# Patient Record
Sex: Male | Born: 1950
Health system: Southern US, Community
[De-identification: ages and names within clinical notes are randomized; demographics above are authoritative.]

## PROBLEM LIST (undated history)

## (undated) ENCOUNTER — Emergency Department: Disposition: A | Discharge status: Home / Self Care | Payer: PPO

## (undated) DIAGNOSIS — M1990 Unspecified osteoarthritis, unspecified site: Secondary | ICD-10-CM

## (undated) DIAGNOSIS — C801 Malignant (primary) neoplasm, unspecified: Secondary | ICD-10-CM

## (undated) DIAGNOSIS — C4491 Basal cell carcinoma of skin, unspecified: Secondary | ICD-10-CM

## (undated) HISTORY — DX: Basal cell carcinoma of skin, unspecified: C44.91

## (undated) HISTORY — PX: KNEE ARTHROSCOPY: SUR90

## (undated) HISTORY — PX: OTHER SURGICAL HISTORY: SHX169

---

## 1998-09-10 ENCOUNTER — Inpatient Hospital Stay (HOSPITAL_COMMUNITY): Admission: EM | Admit: 1998-09-10 | Discharge: 1998-09-13 | Payer: Self-pay | Admitting: Emergency Medicine

## 1998-09-10 ENCOUNTER — Encounter: Payer: Self-pay | Admitting: Emergency Medicine

## 2005-10-09 ENCOUNTER — Encounter: Payer: Self-pay | Admitting: Emergency Medicine

## 2007-03-23 ENCOUNTER — Emergency Department (HOSPITAL_COMMUNITY): Admission: EM | Admit: 2007-03-23 | Discharge: 2007-03-23 | Payer: Self-pay | Admitting: Emergency Medicine

## 2011-12-27 ENCOUNTER — Ambulatory Visit: Payer: Self-pay | Admitting: Family Medicine

## 2012-01-10 ENCOUNTER — Ambulatory Visit
Admission: RE | Admit: 2012-01-10 | Discharge: 2012-01-10 | Disposition: A | Discharge status: Home / Self Care | Payer: 59 | Source: Ambulatory Visit | Attending: Family Medicine | Admitting: Family Medicine

## 2012-01-10 ENCOUNTER — Other Ambulatory Visit: Payer: Self-pay | Admitting: Family Medicine

## 2012-01-10 DIAGNOSIS — Z01811 Encounter for preprocedural respiratory examination: Secondary | ICD-10-CM

## 2012-01-29 ENCOUNTER — Encounter (HOSPITAL_COMMUNITY): Payer: Self-pay | Admitting: Pharmacy Technician

## 2012-02-02 NOTE — H&P (Signed)
   Adam Liu is an 61 y.o. male.    Chief Complaint:  Left hip OA and pain   HPI: Pt is a 61 y.o. male complaining of left hip pain for 1+ years, denies any trauma or incident. Pain had continually increased since the beginning. X-rays in the clinic show end-stage arthritic changes of the left hip. Pt has tried various conservative treatments which have failed to alleviate their symptoms. Various options are discussed with the patient. Risks, benefits and expectations were discussed with the patient. Patient understand the risks, benefits and expectations and wishes to proceed with surgery.   PCP:  No primary provider on file.  D/C Plans:  Home with HHPT  Post-op Meds:   Rx given for ASA, Robaxin, Iron, Colace and MiraLax  Tranexamic Acid:   To be given  Decadron:   To be given  PMH: Denies any past medical history  PSH: - Left knee arthroscopy - Right knee arthroscopy - ORIF of foor for congenital bone defect  Social History: Denies the use of tobacco. Admits to the rare use of alcohol  Allergies:  No Known Allergies  Medications: Denies any medication  ROS: Review of Systems  Constitutional: Negative.   HENT: Negative.   Eyes: Negative.   Respiratory: Negative.   Cardiovascular: Negative.   Gastrointestinal: Negative.   Genitourinary: Negative.   Musculoskeletal: Positive for joint pain.  Skin: Negative.   Neurological: Negative.   Endo/Heme/Allergies: Negative.   Psychiatric/Behavioral: Negative.      Physical Exam: BP:   181/97  ;  HR:   63  ; Resp:   16  : Physical Exam  Constitutional: He is oriented to person, place, and time and well-developed, well-nourished, and in no distress.  HENT:  Head: Normocephalic and atraumatic.  Nose: Nose normal.  Mouth/Throat: Oropharynx is clear and moist.  Eyes: Pupils are equal, round, and reactive to light.  Neck: Neck supple. No JVD present. No tracheal deviation present. No thyromegaly present.    Cardiovascular: Normal rate, regular rhythm, normal heart sounds and intact distal pulses.   Pulmonary/Chest: Effort normal and breath sounds normal. No stridor. No respiratory distress. He has no wheezes. He exhibits no tenderness.  Abdominal: Soft. There is no tenderness. There is no guarding.  Musculoskeletal:       Left hip: He exhibits decreased range of motion, decreased strength, tenderness and bony tenderness. He exhibits no swelling, no deformity and no laceration.  Lymphadenopathy:    He has no cervical adenopathy.  Neurological: He is alert and oriented to person, place, and time.  Skin: Skin is warm and dry.  Psychiatric: Affect normal.     Assessment/Plan Assessment:   Left hip OA and pain   Plan: Patient will undergo a left total hip arthroplasty, anterior approach on 02/11/2012 per Dr. Charlann Boxer at [facility]. Risks benefits and expectations were discussed with the patient. Patient understand risks, benefits and expectations and wishes to proceed.   Anastasio Auerbach Adam Liu   PAC  02/02/2012, 12:20 PM

## 2012-02-03 NOTE — Patient Instructions (Signed)
20 Adam Liu  02/03/2012   Your procedure is scheduled on:  02/11/12 [id number]  Report to [facility] at 0615 AM.  Call this number if you have problems the morning of surgery: [phone]   Remember:   Do not eat food:After Midnight.  May have clear liquids:until Midnight .   Take these medicines the morning of surgery with A SIP OF WATER:   Do not wear jewelry,   Do not wear lotions, powders, or perfumes..  . Men may shave face and neck.  Do not bring valuables to the hospital.  Contacts, dentures or bridgework may not be worn into surgery.  Leave suitcase in the car. After surgery it may be brought to your room.  For patients admitted to the hospital, checkout time is 11:00 AM the day of discharge.     Special Instructions: Shower chin to toes with CHG.  The nite before and the am of surgery. Use regular soap for face and private parts.    Please read over the following fact sheets that you were given: MRSA Information, Blood Transfusion Fact Sheet, coughing and deep breathing exercises, leg exercises , Incentive Spirometry Fact Sheet.

## 2012-02-04 ENCOUNTER — Encounter (HOSPITAL_COMMUNITY)
Admission: RE | Admit: 2012-02-04 | Discharge: 2012-02-04 | Disposition: A | Discharge status: Home / Self Care | Payer: 59 | Source: Ambulatory Visit | Attending: Orthopedic Surgery | Admitting: Orthopedic Surgery

## 2012-02-04 ENCOUNTER — Encounter (HOSPITAL_COMMUNITY): Payer: Self-pay

## 2012-02-04 HISTORY — DX: Malignant (primary) neoplasm, unspecified: C80.1

## 2012-02-04 HISTORY — DX: Unspecified osteoarthritis, unspecified site: M19.90

## 2012-02-04 LAB — URINALYSIS, ROUTINE W REFLEX MICROSCOPIC
Glucose, UA: NEGATIVE mg/dL
Hgb urine dipstick: NEGATIVE
Specific Gravity, Urine: 1.022 (ref 1.005–1.030)
Urobilinogen, UA: 0.2 mg/dL (ref 0.0–1.0)
pH: 5.5 (ref 5.0–8.0)

## 2012-02-04 LAB — BASIC METABOLIC PANEL
Calcium: 9.5 mg/dL (ref 8.4–10.5)
Sodium: 141 mEq/L (ref 135–145)

## 2012-02-04 LAB — URINE MICROSCOPIC-ADD ON

## 2012-02-04 NOTE — Progress Notes (Signed)
Abnormal urinalysis and micro faxed and confirmation received to Dr Charlann Boxer.

## 2012-02-11 ENCOUNTER — Encounter (HOSPITAL_COMMUNITY): Payer: Self-pay | Admitting: *Deleted

## 2012-02-11 ENCOUNTER — Encounter (HOSPITAL_COMMUNITY): Payer: Self-pay | Admitting: Anesthesiology

## 2012-02-11 ENCOUNTER — Inpatient Hospital Stay (HOSPITAL_COMMUNITY): Payer: 59

## 2012-02-11 ENCOUNTER — Inpatient Hospital Stay (HOSPITAL_COMMUNITY)
Admission: RE | Admit: 2012-02-11 | Discharge: 2012-02-13 | DRG: 470 | Disposition: A | Discharge status: Home Health | Payer: 59 | Source: Ambulatory Visit | Attending: Orthopedic Surgery | Admitting: Orthopedic Surgery

## 2012-02-11 ENCOUNTER — Ambulatory Visit (HOSPITAL_COMMUNITY): Payer: 59 | Admitting: Anesthesiology

## 2012-02-11 ENCOUNTER — Encounter (HOSPITAL_COMMUNITY)
Admission: RE | Disposition: A | Discharge status: Home Health | Payer: Self-pay | Source: Ambulatory Visit | Attending: Orthopedic Surgery

## 2012-02-11 ENCOUNTER — Ambulatory Visit (HOSPITAL_COMMUNITY): Payer: 59

## 2012-02-11 DIAGNOSIS — Z6831 Body mass index (BMI) 31.0-31.9, adult: Secondary | ICD-10-CM

## 2012-02-11 DIAGNOSIS — M169 Osteoarthritis of hip, unspecified: Principal | ICD-10-CM | POA: Diagnosis present

## 2012-02-11 DIAGNOSIS — E669 Obesity, unspecified: Secondary | ICD-10-CM

## 2012-02-11 DIAGNOSIS — M1610 Unilateral primary osteoarthritis, unspecified hip: Principal | ICD-10-CM | POA: Diagnosis present

## 2012-02-11 DIAGNOSIS — Z96649 Presence of unspecified artificial hip joint: Secondary | ICD-10-CM

## 2012-02-11 DIAGNOSIS — D500 Iron deficiency anemia secondary to blood loss (chronic): Secondary | ICD-10-CM

## 2012-02-11 HISTORY — PX: TOTAL HIP ARTHROPLASTY: SHX124

## 2012-02-11 LAB — TYPE AND SCREEN

## 2012-02-11 LAB — ABO/RH: ABO/RH(D): O POS

## 2012-02-11 SURGERY — ARTHROPLASTY, HIP, TOTAL, ANTERIOR APPROACH
Anesthesia: Spinal | Site: Hip | Laterality: Left | Wound class: Clean

## 2012-02-11 MED ORDER — TRANEXAMIC ACID 100 MG/ML IV SOLN
1720.0000 mg | Freq: Once | INTRAVENOUS | Status: AC
  Administered 2012-02-11: 1720 mg via INTRAVENOUS
  Filled 2012-02-11: qty 17.2

## 2012-02-11 MED ORDER — METOCLOPRAMIDE HCL 10 MG PO TABS: 5.0000 mg | ORAL_TABLET | Freq: Three times a day (TID) | ORAL | Status: DC | PRN

## 2012-02-11 MED ORDER — SODIUM CHLORIDE 0.9 % IV SOLN
100.0000 mL/h | INTRAVENOUS | Status: DC
  Administered 2012-02-11 (×2): 100 mL/h via INTRAVENOUS
  Filled 2012-02-11 (×7): qty 1000

## 2012-02-11 MED ORDER — OXYCODONE HCL 5 MG/5ML PO SOLN: 5.0000 mg | Freq: Once | ORAL | Status: DC | PRN

## 2012-02-11 MED ORDER — PHENYLEPHRINE HCL 10 MG/ML IJ SOLN
INTRAMUSCULAR | Status: DC | PRN
  Administered 2012-02-11: 80 ug via INTRAVENOUS
  Administered 2012-02-11 (×5): 40 ug via INTRAVENOUS

## 2012-02-11 MED ORDER — OXYCODONE HCL 5 MG PO TABS: 5.0000 mg | ORAL_TABLET | Freq: Once | ORAL | Status: DC | PRN

## 2012-02-11 MED ORDER — PHENOL 1.4 % MT LIQD: 1.0000 | OROMUCOSAL | Status: DC | PRN

## 2012-02-11 MED ORDER — BISACODYL 10 MG RE SUPP: 10.0000 mg | Freq: Every day | RECTAL | Status: DC | PRN

## 2012-02-11 MED ORDER — FENTANYL CITRATE 0.05 MG/ML IJ SOLN
INTRAMUSCULAR | Status: DC | PRN
  Administered 2012-02-11: 50 ug via INTRAVENOUS

## 2012-02-11 MED ORDER — ACETAMINOPHEN 10 MG/ML IV SOLN
INTRAVENOUS | Status: DC | PRN
  Administered 2012-02-11: 1000 mg via INTRAVENOUS

## 2012-02-11 MED ORDER — CEFAZOLIN SODIUM-DEXTROSE 2-3 GM-% IV SOLR
2.0000 g | Freq: Four times a day (QID) | INTRAVENOUS | Status: AC
  Administered 2012-02-11 (×2): 2 g via INTRAVENOUS
  Filled 2012-02-11 (×2): qty 50

## 2012-02-11 MED ORDER — CEFAZOLIN SODIUM-DEXTROSE 2-3 GM-% IV SOLR
2.0000 g | INTRAVENOUS | Status: AC
  Administered 2012-02-11: 2 g via INTRAVENOUS

## 2012-02-11 MED ORDER — CHLORHEXIDINE GLUCONATE 4 % EX LIQD: 60.0000 mL | Freq: Once | CUTANEOUS | Status: DC

## 2012-02-11 MED ORDER — HYDROMORPHONE HCL PF 1 MG/ML IJ SOLN
0.5000 mg | INTRAMUSCULAR | Status: DC | PRN
  Administered 2012-02-11: 1 mg via INTRAVENOUS
  Filled 2012-02-11: qty 1

## 2012-02-11 MED ORDER — LACTATED RINGERS IV SOLN
INTRAVENOUS | Status: DC | PRN
  Administered 2012-02-11 (×4): via INTRAVENOUS

## 2012-02-11 MED ORDER — POLYETHYLENE GLYCOL 3350 17 G PO PACK
17.0000 g | PACK | Freq: Two times a day (BID) | ORAL | Status: DC
  Administered 2012-02-12 (×2): 17 g via ORAL

## 2012-02-11 MED ORDER — METOCLOPRAMIDE HCL 5 MG/ML IJ SOLN: 5.0000 mg | Freq: Three times a day (TID) | INTRAMUSCULAR | Status: DC | PRN

## 2012-02-11 MED ORDER — METHOCARBAMOL 100 MG/ML IJ SOLN
500.0000 mg | Freq: Four times a day (QID) | INTRAVENOUS | Status: DC | PRN
  Administered 2012-02-11: 500 mg via INTRAVENOUS
  Filled 2012-02-11: qty 5

## 2012-02-11 MED ORDER — ONDANSETRON HCL 4 MG PO TABS: 4.0000 mg | ORAL_TABLET | Freq: Four times a day (QID) | ORAL | Status: DC | PRN

## 2012-02-11 MED ORDER — DIPHENHYDRAMINE HCL 25 MG PO CAPS: 25.0000 mg | ORAL_CAPSULE | Freq: Four times a day (QID) | ORAL | Status: DC | PRN

## 2012-02-11 MED ORDER — ACETAMINOPHEN 10 MG/ML IV SOLN: 1000.0000 mg | Freq: Once | INTRAVENOUS | Status: DC | PRN

## 2012-02-11 MED ORDER — BUPIVACAINE HCL (PF) 0.5 % IJ SOLN
INTRAMUSCULAR | Status: AC
  Filled 2012-02-11: qty 30

## 2012-02-11 MED ORDER — METHOCARBAMOL 500 MG PO TABS
500.0000 mg | ORAL_TABLET | Freq: Four times a day (QID) | ORAL | Status: DC | PRN
  Administered 2012-02-11 – 2012-02-12 (×3): 500 mg via ORAL
  Filled 2012-02-11 (×3): qty 1

## 2012-02-11 MED ORDER — 0.9 % SODIUM CHLORIDE (POUR BTL) OPTIME
TOPICAL | Status: DC | PRN
  Administered 2012-02-11: 1000 mL

## 2012-02-11 MED ORDER — EPHEDRINE SULFATE 50 MG/ML IJ SOLN
INTRAMUSCULAR | Status: DC | PRN
  Administered 2012-02-11 (×2): 5 mg via INTRAVENOUS

## 2012-02-11 MED ORDER — FLEET ENEMA 7-19 GM/118ML RE ENEM: 1.0000 | ENEMA | Freq: Once | RECTAL | Status: AC | PRN

## 2012-02-11 MED ORDER — MENTHOL 3 MG MT LOZG: 1.0000 | LOZENGE | OROMUCOSAL | Status: DC | PRN

## 2012-02-11 MED ORDER — ZOLPIDEM TARTRATE 5 MG PO TABS: 5.0000 mg | ORAL_TABLET | Freq: Every evening | ORAL | Status: DC | PRN

## 2012-02-11 MED ORDER — DEXAMETHASONE SODIUM PHOSPHATE 10 MG/ML IJ SOLN: 10.0000 mg | Freq: Once | INTRAMUSCULAR | Status: DC

## 2012-02-11 MED ORDER — CEFAZOLIN SODIUM-DEXTROSE 2-3 GM-% IV SOLR
INTRAVENOUS | Status: AC
  Filled 2012-02-11: qty 50

## 2012-02-11 MED ORDER — DOCUSATE SODIUM 100 MG PO CAPS
100.0000 mg | ORAL_CAPSULE | Freq: Two times a day (BID) | ORAL | Status: DC
  Administered 2012-02-11 – 2012-02-12 (×3): 100 mg via ORAL

## 2012-02-11 MED ORDER — PROPOFOL INFUSION 10 MG/ML OPTIME
INTRAVENOUS | Status: DC | PRN
  Administered 2012-02-11: 75 ug/kg/min via INTRAVENOUS

## 2012-02-11 MED ORDER — LACTATED RINGERS IV SOLN: INTRAVENOUS | Status: DC

## 2012-02-11 MED ORDER — DEXAMETHASONE SODIUM PHOSPHATE 10 MG/ML IJ SOLN
10.0000 mg | Freq: Once | INTRAMUSCULAR | Status: AC
  Administered 2012-02-12: 10 mg via INTRAVENOUS
  Filled 2012-02-11: qty 1

## 2012-02-11 MED ORDER — MIDAZOLAM HCL 5 MG/5ML IJ SOLN
INTRAMUSCULAR | Status: DC | PRN
  Administered 2012-02-11: 2 mg via INTRAVENOUS

## 2012-02-11 MED ORDER — PROPOFOL 10 MG/ML IV BOLUS
INTRAVENOUS | Status: DC | PRN
  Administered 2012-02-11 (×3): 20 mg via INTRAVENOUS

## 2012-02-11 MED ORDER — BUPIVACAINE HCL (PF) 0.5 % IJ SOLN
INTRAMUSCULAR | Status: DC | PRN
  Administered 2012-02-11: 3 mL

## 2012-02-11 MED ORDER — ONDANSETRON HCL 4 MG/2ML IJ SOLN: 4.0000 mg | Freq: Four times a day (QID) | INTRAMUSCULAR | Status: DC | PRN

## 2012-02-11 MED ORDER — PROMETHAZINE HCL 25 MG/ML IJ SOLN: 6.2500 mg | INTRAMUSCULAR | Status: DC | PRN

## 2012-02-11 MED ORDER — ALUM & MAG HYDROXIDE-SIMETH 200-200-20 MG/5ML PO SUSP: 30.0000 mL | ORAL | Status: DC | PRN

## 2012-02-11 MED ORDER — MEPERIDINE HCL 50 MG/ML IJ SOLN: 6.2500 mg | INTRAMUSCULAR | Status: DC | PRN

## 2012-02-11 MED ORDER — HYDROCODONE-ACETAMINOPHEN 7.5-325 MG PO TABS
1.0000 | ORAL_TABLET | ORAL | Status: DC
  Administered 2012-02-11: 1 via ORAL
  Administered 2012-02-11: 2 via ORAL
  Administered 2012-02-11 – 2012-02-12 (×5): 1 via ORAL
  Filled 2012-02-11 (×8): qty 1

## 2012-02-11 MED ORDER — ACETAMINOPHEN 10 MG/ML IV SOLN
INTRAVENOUS | Status: AC
  Filled 2012-02-11: qty 100

## 2012-02-11 MED ORDER — CELECOXIB 200 MG PO CAPS
200.0000 mg | ORAL_CAPSULE | Freq: Two times a day (BID) | ORAL | Status: DC
  Administered 2012-02-11 – 2012-02-12 (×3): 200 mg via ORAL
  Filled 2012-02-11 (×5): qty 1

## 2012-02-11 MED ORDER — HYDROMORPHONE HCL PF 1 MG/ML IJ SOLN: 0.2500 mg | INTRAMUSCULAR | Status: DC | PRN

## 2012-02-11 MED ORDER — RIVAROXABAN 10 MG PO TABS
10.0000 mg | ORAL_TABLET | ORAL | Status: DC
  Administered 2012-02-12 – 2012-02-13 (×2): 10 mg via ORAL
  Filled 2012-02-11 (×3): qty 1

## 2012-02-11 MED ORDER — FERROUS SULFATE 325 (65 FE) MG PO TABS
325.0000 mg | ORAL_TABLET | Freq: Three times a day (TID) | ORAL | Status: DC
  Administered 2012-02-11 – 2012-02-13 (×5): 325 mg via ORAL
  Filled 2012-02-11 (×8): qty 1

## 2012-02-11 SURGICAL SUPPLY — 36 items
BAG ZIPLOCK 12X15 (MISCELLANEOUS) ×4 IMPLANT
BLADE SAW SGTL 18X1.27X75 (BLADE) ×2 IMPLANT
CLOTH BEACON ORANGE TIMEOUT ST (SAFETY) ×2 IMPLANT
DERMABOND ADVANCED (GAUZE/BANDAGES/DRESSINGS) ×1
DERMABOND ADVANCED .7 DNX12 (GAUZE/BANDAGES/DRESSINGS) ×1 IMPLANT
DRAPE C-ARM 42X72 X-RAY (DRAPES) ×2 IMPLANT
DRAPE STERI IOBAN 125X83 (DRAPES) ×2 IMPLANT
DRAPE U-SHAPE 47X51 STRL (DRAPES) ×6 IMPLANT
DRSG AQUACEL AG ADV 3.5X10 (GAUZE/BANDAGES/DRESSINGS) ×2 IMPLANT
DRSG TEGADERM 4X4.75 (GAUZE/BANDAGES/DRESSINGS) ×2 IMPLANT
DURAPREP 26ML APPLICATOR (WOUND CARE) ×2 IMPLANT
ELECT BLADE TIP CTD 4 INCH (ELECTRODE) ×2 IMPLANT
ELECT REM PT RETURN 9FT ADLT (ELECTROSURGICAL) ×2
ELECTRODE REM PT RTRN 9FT ADLT (ELECTROSURGICAL) ×1 IMPLANT
EVACUATOR 1/8 PVC DRAIN (DRAIN) IMPLANT
FACESHIELD LNG OPTICON STERILE (SAFETY) ×8 IMPLANT
GAUZE SPONGE 2X2 8PLY STRL LF (GAUZE/BANDAGES/DRESSINGS) ×1 IMPLANT
GLOVE BIOGEL PI IND STRL 7.5 (GLOVE) ×1 IMPLANT
GLOVE BIOGEL PI IND STRL 8 (GLOVE) ×1 IMPLANT
GLOVE BIOGEL PI INDICATOR 7.5 (GLOVE) ×1
GLOVE BIOGEL PI INDICATOR 8 (GLOVE) ×1
GLOVE ECLIPSE 8.0 STRL XLNG CF (GLOVE) ×2 IMPLANT
GLOVE ORTHO TXT STRL SZ7.5 (GLOVE) ×4 IMPLANT
GOWN BRE IMP PREV XXLGXLNG (GOWN DISPOSABLE) ×4 IMPLANT
GOWN STRL NON-REIN LRG LVL3 (GOWN DISPOSABLE) ×2 IMPLANT
KIT BASIN OR (CUSTOM PROCEDURE TRAY) ×2 IMPLANT
PACK TOTAL JOINT (CUSTOM PROCEDURE TRAY) ×2 IMPLANT
PADDING CAST COTTON 6X4 STRL (CAST SUPPLIES) ×2 IMPLANT
SPONGE GAUZE 2X2 STER 10/PKG (GAUZE/BANDAGES/DRESSINGS) ×1
SUCTION FRAZIER 12FR DISP (SUCTIONS) ×2 IMPLANT
SUT MNCRL AB 4-0 PS2 18 (SUTURE) ×2 IMPLANT
SUT VIC AB 1 CT1 36 (SUTURE) ×8 IMPLANT
SUT VIC AB 2-0 CT1 27 (SUTURE) ×2
SUT VIC AB 2-0 CT1 TAPERPNT 27 (SUTURE) ×2 IMPLANT
TOWEL OR 17X26 10 PK STRL BLUE (TOWEL DISPOSABLE) ×4 IMPLANT
TRAY FOLEY CATH 14FRSI W/METER (CATHETERS) ×2 IMPLANT

## 2012-02-11 NOTE — Progress Notes (Signed)
Anesthesiology Note  Pt with probable perioperative corneal abrasion. Discussed with Dr. Luciana Axe with ophthalmology his recommendations. He recommended erythromycin ophthalmic ointment TID x 7 days. Will prescribed and speak with pt.  Tyler Robidoux

## 2012-02-11 NOTE — Evaluation (Signed)
Physical Therapy Evaluation Patient Details Name: Adam Liu MRN: [id number] DOB: Oct 31, 1950 Today's Date: 02/11/2012 Time: 7829-5621 PT Time Calculation (min): 49 min  PT Assessment / Plan / Recommendation Clinical Impression  Pt. is S/P direct anterior THR from today. Pt was able to get up to recliner  with asssitance. LImited by dizziness/decreased BP.  RN aware. Pt/ will benefit from PT to improve infunctional mobility and strength to DC to home.    PT Assessment  Patient needs continued PT services    Follow Up Recommendations  Home health PT;Supervision/Assistance - 24 hour    Barriers to Discharge        Equipment Recommendations  Rolling walker with 5" wheels;3 in 1 bedside comode    Recommendations for Other Services OT consult   Frequency 7X/week    Precautions / Restrictions Precautions Precaution Comments: hypotension first time up.   Pertinent Vitals/Pain BP after ambulate to recliner= 98/50; after rest in recliner w/ legs elevated=116/61 sats .95% 2/ HR 72 after pivot to recliner. RN in and aware.      Mobility  Bed Mobility Bed Mobility: Supine to Sit Supine to Sit: HOB elevated;With rails;3: Mod assist Details for Bed Mobility Assistance: VC for moving trunk to side, required asssitance to move LLE to edge. Transfers Transfers: Sit to Stand;Stand to Sit Sit to Stand: From elevated surface;With upper extremity assist Sit to Stand: Patient Percentage: 70% Stand to Sit: 3: Mod assist;To chair/3-in-1;With upper extremity assist;With armrests Details for Transfer Assistance: verbal cues for hand placement w/ sit/standing, VC for LLE placement to sit down. Ambulation/Gait Ambulation/Gait Assistance: 1: +2 Total assist Ambulation/Gait: Patient Percentage: 70% Ambulation Distance (Feet): 5 Feet Assistive device: Rolling walker Ambulation/Gait Assistance Details: Pt became dizzy so had pt back up to recliner, VC for sequence, some difficulty advancing LLE  first. Gait Pattern: Step-to pattern;Antalgic;Decreased stance time - left;Decreased step length - left Gait velocity: decreased    Exercises Total Joint Exercises Heel Slides: AAROM;Left;10 reps;Supine   PT Diagnosis: Difficulty walking;Generalized weakness  PT Problem List: Decreased range of motion;Decreased activity tolerance;Decreased mobility;Cardiopulmonary status limiting activity PT Treatment Interventions: DME instruction;Gait training;Stair training;Functional mobility training;Therapeutic activities;Patient/family education   PT Goals Acute Rehab PT Goals PT Goal Formulation: With patient/family Time For Goal Achievement: 02/25/12 Potential to Achieve Goals: Good Pt will go Supine/Side to Sit: with supervision;with HOB 0 degrees PT Goal: Supine/Side to Sit - Progress: Goal set today Pt will go Sit to Supine/Side: with supervision;with HOB 0 degrees PT Goal: Sit to Supine/Side - Progress: Goal set today Pt will go Sit to Stand: with supervision PT Goal: Sit to Stand - Progress: Goal set today Pt will go Stand to Sit: with supervision PT Goal: Stand to Sit - Progress: Goal set today Pt will Ambulate: 51 - 150 feet;with least restrictive assistive device;with supervision PT Goal: Ambulate - Progress: Goal set today Pt will Go Up / Down Stairs: 6-9 stairs;with min assist;with least restrictive assistive device;3-5 stairs PT Goal: Up/Down Stairs - Progress: Goal set today Pt will Perform Home Exercise Program: with supervision, verbal cues required/provided PT Goal: Perform Home Exercise Program - Progress: Goal set today  Visit Information  Last PT Received On: 02/11/12 Assistance Needed: +2    Subjective Data  Subjective: I am so anxious. Will I hurt my hip if I put weight on it? Patient Stated Goal: To walk without pain.   Prior Functioning  Home Living Lives With: Spouse Available Help at Discharge: Family Type of Home: House Home Access:  Stairs to  enter [employer] of Steps: 4 Entrance Stairs-Rails: Right;Left Home Layout: Two level;Bed/bath upstairs;1/2 bath on main level;Other (Comment) (sofa/bed on first level.) Alternate Level Stairs-Number of Steps: 16 Alternate Level Stairs-Rails: Left Bathroom Toilet: Standard Home Adaptive Equipment: Crutches Prior Function Level of Independence: Independent Able to Take Stairs?: Yes Driving: Yes Communication Communication: No difficulties    Cognition  Overall Cognitive Status: Appears within functional limits for tasks assessed/performed Arousal/Alertness: Awake/alert Orientation Level: Appears intact for tasks assessed Behavior During Session: Anxious    Extremity/Trunk Assessment Right Lower Extremity Assessment RLE ROM/Strength/Tone: [facility] for tasks assessed Left Lower Extremity Assessment LLE ROM/Strength/Tone: Deficits LLE ROM/Strength/Tone Deficits: moves foot well. LLE Sensation: WFL - Light Touch   Balance    End of Session PT - End of Session Activity Tolerance: Patient limited by fatigue;Treatment limited secondary to medical complications (Comment) Patient left: in chair;with call bell/phone within reach;with family/visitor present Nurse Communication: Mobility status (dizziness and decreased BP)  GP     Rada Hay 02/11/2012, 5:55 PM

## 2012-02-11 NOTE — Progress Notes (Signed)
Dr. Renold Don in and aware of right eye redness. Flushed with NS. States "It feels better.'

## 2012-02-11 NOTE — Op Note (Signed)
NAME:  Adam Liu                ACCOUNT NO.: [id number]      MEDICAL RECORD NO.: [id number]      FACILITY:  [facility]      PHYSICIAN:  Durene Romans D  DATE OF BIRTH:  02/19/1951     DATE OF PROCEDURE:  02/11/2012                                 OPERATIVE REPORT         PREOPERATIVE DIAGNOSIS: Left  hip osteoarthritis.      POSTOPERATIVE DIAGNOSIS:  Left hip osteoarthritis.      PROCEDURE:  Left total hip replacement through an anterior approach   utilizing DePuy THR system, component size 54mm pinnacle cup, a size 36+4 neutral   Altrex liner, a size 8 Hi Tri Lock stem with a 36+5 delta ceramic   ball.      SURGEON:  Madlyn Frankel. Charlann Boxer, M.D.      ASSISTANT:  Lanney Gins, PA      ANESTHESIA:  Spinal.      SPECIMENS:  None.      COMPLICATIONS:  None.      BLOOD LOSS:  600 cc     DRAINS:  One Hemovac.      INDICATION OF THE PROCEDURE:  [name] is a 61 y.o. male who had   presented to office for evaluation of left hip pain.  Radiographs revealed   progressive degenerative changes with bone-on-bone   articulation to the  hip joint.  The patient had painful limited range of   motion significantly affecting their overall quality of life.  The patient was failing to    respond to conservative measures, and at this point was ready   to proceed with more definitive measures.  The patient has noted progressive   degenerative changes in his hip, progressive problems and dysfunction   with regarding the hip prior to surgery.  Consent was obtained for   benefit of pain relief.  Specific risk of infection, DVT, component   failure, dislocation, need for revision surgery, as well discussion of   the anterior versus posterior approach were reviewed.  Consent was   obtained for benefit of anterior pain relief through an anterior   approach.      PROCEDURE IN DETAIL:  The patient was brought to operative theater.   Once adequate anesthesia,  preoperative antibiotics, 2gm Ancef administered.   The patient was positioned supine on the OSI Hanna table.  Once adequate   padding of boney process was carried out, we had predraped out the hip, and  used fluoroscopy to confirm orientation of the pelvis and position.      The left hip was then prepped and draped from proximal iliac crest to   mid thigh with shower curtain technique.      Time-out was performed identifying the patient, planned procedure, and   extremity.     An incision was then made 2 cm distal and lateral to the   anterior superior iliac spine extending over the orientation of the   tensor fascia lata muscle and sharp dissection was carried down to the   fascia of the muscle and protractor placed in the soft tissues.      The fascia was then incised.  The muscle belly was identified and swept  laterally and retractor placed along the superior neck.  Following   cauterization of the circumflex vessels and removing some pericapsular   fat, a second cobra retractor was placed on the inferior neck.  A third   retractor was placed on the anterior acetabulum after elevating the   anterior rectus.  A L-capsulotomy was along the line of the   superior neck to the trochanteric fossa, then extended proximally and   distally.  Tag sutures were placed and the retractors were then placed   intracapsular.  We then identified the trochanteric fossa and   orientation of my neck cut, confirmed this radiographically   and then made a neck osteotomy with the femur on traction.  The femoral   head was removed without difficulty or complication.  Traction was let   off and retractors were placed posterior and anterior around the   acetabulum.      The labrum and foveal tissue were debrided.  I began reaming with a 47mm   reamer and reamed up to 53mm reamer with good bony bed preparation and a 54   cup was chosen.  The final 54mm Pinnacle cup was then impacted under fluoroscopy  to  confirm the depth of penetration and orientation with respect to   abduction.  A screw was placed followed by the hole eliminator.  The final   36+4 neutral Altrex liner was impacted with good visualized rim fit.  The cup was positioned anatomically within the acetabular portion of the pelvis.      At this point, the femur was rolled at 80 degrees.  Further capsule was   released off the inferior aspect of the femoral neck.  I then   released the superior capsule proximally.  The hook was placed laterally   along the femur and elevated manually and held in position with the bed   hook.  The leg was then extended and adducted with the leg rolled to 100   degrees of external rotation.  Once the proximal femur was fully   exposed, I used a box osteotome to set orientation.  I then began   broaching with the starting chili pepper broach and passed this by hand and then broached up to 8.  With the 8 broach in place I chose a high offset neck and did a trial reduction.  The offset was appropriate, leg lengths   appeared to be equal, confirmed radiographically, with a 36+5 ball.   Given these findings, I went ahead and dislocated the hip, repositioned all   retractors and positioned the right hip in the extended and abducted position.  The final 8 Hi Tri Lock stem was   chosen and it was impacted down to the level of neck cut.  Based on this   and the trial reduction, a 36+5 delta ceramic ball was chosen and   impacted onto a clean and dry trunnion, and the hip was reduced.  The   hip had been irrigated throughout the case again at this point.  I did   reapproximate the superior capsular leaflet to the anterior leaflet   using #1 Vicryl, placed a medium Hemovac drain deep.  The fascia of the   tensor fascia lata muscle was then reapproximated using #1 Vicryl.  The   remaining wound was closed with 2-0 Vicryl and running 4-0 Monocryl.   The hip was cleaned, dried, and dressed sterilely using  Dermabond and   Aquacel dressing.  Drain site dressed  separately.  She was then brought   to recovery room in stable condition tolerating the procedure well.    Lanney Gins, PA-C was present for the entirety of the case involved from   preoperative positioning, perioperative retractor management, general   facilitation of the case, as well as primary wound closure as assistant.            Madlyn Frankel Charlann Boxer, M.D.            MDO/MEDQ  D:  04/02/2011  T:  04/02/2011  Job:  [id number]      Electronically Signed by Durene Romans M.D. on 04/08/2011 09:15:38 AM

## 2012-02-11 NOTE — Interval H&P Note (Signed)
History and Physical Interval Note:  02/11/2012 7:31 AM  Adam Liu  has presented today for surgery, with the diagnosis of Osteoarthritis of the Left Hip  The various methods of treatment have been discussed with the patient and family. After consideration of risks, benefits and other options for treatment, the patient has consented to  Procedure(s) (LRB): LEFT TOTAL HIP ARTHROPLASTY ANTERIOR APPROACH (Left) as a surgical intervention .  The patient's history has been reviewed, patient examined, no change in status, stable for surgery.  I have reviewed the patient's chart and labs.  Questions were answered to the patient's satisfaction.     Shelda Pal

## 2012-02-11 NOTE — Anesthesia Preprocedure Evaluation (Addendum)
Anesthesia Evaluation  Patient identified by MRN, date of birth, ID band Patient awake    Reviewed: Allergy & Precautions, H&P , NPO status , Patient's Chart, lab work & pertinent test results  Airway Mallampati: I TM Distance: >3 FB Neck ROM: Full    Dental  (+) Teeth Intact and Dental Advisory Given   Pulmonary neg pulmonary ROS,  breath sounds clear to auscultation  Pulmonary exam normal       Cardiovascular - angina- CAD, - Past MI, - CHF and - DVT negative cardio ROS  Rhythm:Regular Rate:Normal     Neuro/Psych negative neurological ROS     GI/Hepatic negative GI ROS, Neg liver ROS,   Endo/Other  negative endocrine ROS  Renal/GU negative Renal ROS     Musculoskeletal  (+) Arthritis -, Osteoarthritis,    Abdominal   Peds  Hematology negative hematology ROS (+)   Anesthesia Other Findings   Reproductive/Obstetrics                          Anesthesia Physical Anesthesia Plan  ASA: II  Anesthesia Plan: Spinal   Post-op Pain Management:    Induction:   Airway Management Planned: Simple Face Mask  Additional Equipment:   Intra-op Plan:   Post-operative Plan:   Informed Consent: I have reviewed the patients History and Physical, chart, labs and discussed the procedure including the risks, benefits and alternatives for the proposed anesthesia with the patient or authorized representative who has indicated his/her understanding and acceptance.   Dental advisory given  Plan Discussed with: CRNA and Surgeon  Anesthesia Plan Comments:        Anesthesia Quick Evaluation

## 2012-02-11 NOTE — Transfer of Care (Signed)
Immediate Anesthesia Transfer of Care Note  Patient: Adam Liu  Procedure(s) Performed: Procedure(s) (LRB): TOTAL HIP ARTHROPLASTY ANTERIOR APPROACH (Left)  Patient Location: PACU  Anesthesia Type: Spinal  Level of Consciousness: sedated, patient cooperative and responds to stimulaton  Airway & Oxygen Therapy: Patient Spontanous Breathing and Patient connected to face mask oxgen  Post-op Assessment: Report given to PACU RN and Post -op Vital signs reviewed and stable  Post vital signs: Reviewed and stable  Complications: No apparent anesthesia complications

## 2012-02-11 NOTE — Addendum Note (Signed)
Addendum  created 02/11/12 1233 by Paris Lore, CRNA   Modules edited:Anesthesia Flowsheet

## 2012-02-11 NOTE — Anesthesia Postprocedure Evaluation (Signed)
Anesthesia Post Note  Patient: Adam Liu  Procedure(s) Performed: Procedure(s) (LRB): TOTAL HIP ARTHROPLASTY ANTERIOR APPROACH (Left)  Anesthesia type: Spinal  Patient location: PACU  Post pain: Pain level controlled  Post assessment: Post-op Vital signs reviewed  Last Vitals: BP 127/71  Pulse 60  Temp 36.3 C (Oral)  Resp 14  Wt 252 lb 13.9 oz (114.7 kg)  SpO2 100%  Post vital signs: Reviewed  Level of consciousness: sedated  Complications: Patient complaining of R eye discomfort. Eye is red and pt rubbing it. I reemphasized importance of not rubbing eyes. Possible corneal abrasion. Will consult ophthalmology for F/U post op. No other apparent anesthesia complications.

## 2012-02-11 NOTE — Addendum Note (Signed)
Addendum  created 02/11/12 1239 by Gaylan Gerold, MD   Modules edited:Inpatient Notes, Orders

## 2012-02-11 NOTE — Plan of Care (Signed)
Problem: Consults Goal: Diagnosis- Total Joint Replacement Left anterior hip     

## 2012-02-11 NOTE — Progress Notes (Signed)
C/O "feels like something in my right eye." Sclera reddened. Cool compress applied.

## 2012-02-11 NOTE — Anesthesia Procedure Notes (Signed)
Spinal  Patient location during procedure: OR Start time: 02/11/2012 9:08 AM End time: 02/11/2012 9:18 AM Staffing CRNA/Resident: Paris Lore Performed by: resident/CRNA  Preanesthetic Checklist Completed: patient identified, site marked, surgical consent, pre-op evaluation, timeout performed, IV checked, risks and benefits discussed and monitors and equipment checked Spinal Block Patient position: sitting Prep: DuraPrep Patient monitoring: heart rate, continuous pulse ox and blood pressure Location: L2-3 Injection technique: single-shot Needle Needle type: Spinocan  Needle gauge: 25 G Needle length: 9 cm Needle insertion depth: 5 cm Assessment Sensory level: T4 Additional Notes Expiration date of kit checked and confirmed. Patient tolerated procedure well, without complications.  L2-L3 X 1 with noted CSF clear return easy flush and aspiration of CSF.

## 2012-02-12 DIAGNOSIS — D500 Iron deficiency anemia secondary to blood loss (chronic): Secondary | ICD-10-CM

## 2012-02-12 DIAGNOSIS — E669 Obesity, unspecified: Secondary | ICD-10-CM

## 2012-02-12 LAB — BASIC METABOLIC PANEL
CO2: 25 mEq/L (ref 19–32)
Glucose, Bld: 119 mg/dL — ABNORMAL HIGH (ref 70–99)
Potassium: 4 mEq/L (ref 3.5–5.1)
Sodium: 139 mEq/L (ref 135–145)

## 2012-02-12 LAB — CBC
Hemoglobin: 12.5 g/dL — ABNORMAL LOW (ref 13.0–17.0)
MCH: 30.5 pg (ref 26.0–34.0)
RBC: 4.1 MIL/uL — ABNORMAL LOW (ref 4.22–5.81)

## 2012-02-12 MED ORDER — ACETAMINOPHEN 325 MG PO TABS
650.0000 mg | ORAL_TABLET | Freq: Three times a day (TID) | ORAL | Status: DC | PRN
  Administered 2012-02-12 – 2012-02-13 (×2): 650 mg via ORAL
  Filled 2012-02-12 (×3): qty 2

## 2012-02-12 NOTE — Evaluation (Signed)
Occupational Therapy Evaluation & Discharge Patient Details Name: Adam Liu MRN: [id number] DOB: 11-07-1950 Today's Date: 02/12/2012 Time: 4098-1191 OT Time Calculation (min): 18 min  OT Assessment / Plan / Recommendation Clinical Impression  Pt doing well POD 1 LTHR. All education completed. Pt will have necessary level of A from family upon d/c.    OT Assessment  Patient does not need any further OT services    Follow Up Recommendations  No OT follow up    Barriers to Discharge      Equipment Recommendations  3 in 1 bedside comode;Rolling walker with 5" wheels    Recommendations for Other Services    Frequency       Precautions / Restrictions Precautions Precautions: None Restrictions Weight Bearing Restrictions: No   Pertinent Vitals/Pain No c/o pain    ADL  Grooming: Simulated;Supervision/safety Where Assessed - Grooming: Supported standing Lower Body Dressing: Performed;Minimal assistance (to don/doff shorts) Where Assessed - Lower Body Dressing: Supported sit to stand Toilet Transfer: Performed;Minimal assistance Toilet Transfer Method: Sit to Barista: Regular height toilet;Grab bars Toileting - Clothing Manipulation and Hygiene: Simulated;Supervision/safety Where Assessed - Engineer, mining and Hygiene: Sit to stand from 3-in-1 or toilet Tub/Shower Transfer: Performed;Min guard Tub/Shower Transfer Method: Ambulating Equipment Used: Rolling walker ADL Comments: Pt struggled to rise from low toilet seat. Wife stated she would be available to help pt don/doff shoes and bathe LB.    OT Diagnosis:    OT Problem List:   OT Treatment Interventions:     OT Goals    Visit Information  Last OT Received On: 02/12/12 Assistance Needed: +1    Subjective Data  Subjective: I was a little light headed Patient Stated Goal: Return to work.   Prior Functioning  Vision/Perception  Home Living Lives With:  Spouse Available Help at Discharge: Family Type of Home: House Home Access: Stairs to enter Secretary/administrator of Steps: 4 Entrance Stairs-Rails: Right;Left Home Layout: Two level;Bed/bath upstairs;1/2 bath on main level Alternate Level Stairs-Number of Steps: 16 Alternate Level Stairs-Rails: Left Bathroom Shower/Tub: Health visitor: Standard Home Adaptive Equipment: Crutches;Built-in shower seat Prior Function Level of Independence: Independent Able to Take Stairs?: Yes Driving: Yes Vocation: Full time employment Communication Communication: No difficulties Dominant Hand: Right      Cognition  Overall Cognitive Status: Appears within functional limits for tasks assessed/performed Arousal/Alertness: Awake/alert Orientation Level: Appears intact for tasks assessed Behavior During Session: [facility] for tasks performed    Extremity/Trunk Assessment Right Upper Extremity Assessment RUE ROM/Strength/Tone: [facility] for tasks assessed Left Upper Extremity Assessment LUE ROM/Strength/Tone: [facility] for tasks assessed   Mobility  Shoulder Instructions  Transfers Sit to Stand: 4: Min guard;With upper extremity assist;From chair/3-in-1;From toilet;4: Min assist Stand to Sit: 4: Min guard;4: Min assist;To toilet;To chair/3-in-1;With armrests Details for Transfer Assistance: Pt required physical A to rise and control descent on std toilet. Min VCs for hand placement and LLE management.       Exercise     Balance     End of Session OT - End of Session Activity Tolerance: Patient tolerated treatment well Patient left: in chair;with call bell/phone within reach;with family/visitor present  GO     Adam Liu A OTR/L [phone] 02/12/2012, 1:50 PM

## 2012-02-12 NOTE — Progress Notes (Signed)
Physical Therapy Treatment Patient Details Name: Adam Liu MRN: [id number] DOB: 04/17/1951 Today's Date: 02/12/2012 Time: 4098-1191 PT Time Calculation (min): 30 min  PT Assessment / Plan / Recommendation Comments on Treatment Session  Marked improvement in activity tolerance with marked decrease in anxiety level    Follow Up Recommendations  Home health PT;Supervision/Assistance - 24 hour    Barriers to Discharge        Equipment Recommendations  Rolling walker with 5" wheels;3 in 1 bedside comode    Recommendations for Other Services OT consult  Frequency 7X/week   Plan Discharge plan remains appropriate    Precautions / Restrictions Precautions Precaution Comments: hypotension first time up. Restrictions Weight Bearing Restrictions: No Other Position/Activity Restrictions: WBAT   Pertinent Vitals/Pain 3/10' premedicated, ice packs provided    Mobility  Bed Mobility Bed Mobility: Supine to Sit Supine to Sit: 4: Min assist Details for Bed Mobility Assistance: cues for sequence and use of R LE to self assist Transfers Transfers: Sit to Stand;Stand to Sit Sit to Stand: 4: Min assist;From bed;With upper extremity assist;From elevated surface Stand to Sit: 4: Min assist;To chair/3-in-1;With armrests;With upper extremity assist Details for Transfer Assistance: verbal cues for hand placement w/ sit/standing, VC for LLE placement to sit down. Ambulation/Gait Ambulation/Gait Assistance: 4: Min assist Ambulation Distance (Feet): 200 Feet Assistive device: Rolling walker Ambulation/Gait Assistance Details: cues for sequence, posture, position from RW and ER on L Gait Pattern: Step-to pattern;Step-through pattern    Exercises Total Joint Exercises Ankle Circles/Pumps: AROM;20 reps;Both;Supine Quad Sets: AROM;Both;15 reps;Supine Gluteal Sets: AROM;10 reps;Supine;Both Heel Slides: AAROM;Supine;20 reps;Left Hip ABduction/ADduction: AAROM;20 reps;Supine;Left   PT  Diagnosis:    PT Problem List:   PT Treatment Interventions:     PT Goals Acute Rehab PT Goals PT Goal Formulation: With patient/family Time For Goal Achievement: 02/25/12 Potential to Achieve Goals: Good Pt will go Supine/Side to Sit: with supervision;with HOB 0 degrees PT Goal: Supine/Side to Sit - Progress: Progressing toward goal Pt will go Sit to Supine/Side: with supervision;with HOB 0 degrees PT Goal: Sit to Supine/Side - Progress: Progressing toward goal Pt will go Sit to Stand: with supervision PT Goal: Sit to Stand - Progress: Progressing toward goal Pt will go Stand to Sit: with supervision PT Goal: Stand to Sit - Progress: Progressing toward goal Pt will Ambulate: 51 - 150 feet;with least restrictive assistive device;with supervision PT Goal: Ambulate - Progress: Progressing toward goal Pt will Go Up / Down Stairs: 6-9 stairs;with min assist;with least restrictive assistive device;3-5 stairs PT Goal: Up/Down Stairs - Progress: Progressing toward goal Pt will Perform Home Exercise Program: with supervision, verbal cues required/provided PT Goal: Perform Home Exercise Program - Progress: Progressing toward goal  Visit Information  Last PT Received On: 02/12/12 Assistance Needed: +1    Subjective Data  Subjective: I was really afraid yesterday and still am this morning Patient Stated Goal: To walk without pain.   Cognition  Overall Cognitive Status: Appears within functional limits for tasks assessed/performed Arousal/Alertness: Awake/alert Orientation Level: Appears intact for tasks assessed Behavior During Session: Anxious    Balance     End of Session PT - End of Session Activity Tolerance: Patient tolerated treatment well Patient left: in chair;with call bell/phone within reach;with family/visitor present Nurse Communication: Mobility status   GP     Adam Liu 02/12/2012, 11:47 AM

## 2012-02-12 NOTE — Progress Notes (Signed)
Physical Therapy Treatment Patient Details Name: Adam Liu MRN: [id number] DOB: 01/23/1951 Today's Date: 02/12/2012 Time: 4098-1191 PT Time Calculation (min): 23 min  PT Assessment / Plan / Recommendation Comments on Treatment Session       Follow Up Recommendations  Home health PT;Supervision/Assistance - 24 hour    Barriers to Discharge        Equipment Recommendations  3 in 1 bedside comode;Rolling walker with 5" wheels    Recommendations for Other Services OT consult  Frequency 7X/week   Plan Discharge plan remains appropriate    Precautions / Restrictions Precautions Precautions: None Restrictions Weight Bearing Restrictions: No Other Position/Activity Restrictions: WBAT   Pertinent Vitals/Pain 2/10    Mobility  Transfers Transfers: Sit to Stand;Stand to Sit Sit to Stand: 4: Min guard Stand to Sit: 4: Min guard Details for Transfer Assistance: cues for use of UE's and for LE management Ambulation/Gait Ambulation/Gait Assistance: 4: Min guard Ambulation Distance (Feet): 450 Feet Assistive device: Rolling walker Ambulation/Gait Assistance Details: cues for position from RW, posture and ER on L Gait Pattern: Step-to pattern;Step-through pattern    Exercises     PT Diagnosis:    PT Problem List:   PT Treatment Interventions:     PT Goals Acute Rehab PT Goals PT Goal Formulation: With patient/family Time For Goal Achievement: 02/25/12 Potential to Achieve Goals: Good Pt will go Supine/Side to Sit: with supervision;with HOB 0 degrees Pt will go Sit to Stand: with supervision PT Goal: Sit to Stand - Progress: Progressing toward goal Pt will go Stand to Sit: with supervision PT Goal: Stand to Sit - Progress: Progressing toward goal Pt will Ambulate: 51 - 150 feet;with least restrictive assistive device;with supervision PT Goal: Ambulate - Progress: Progressing toward goal Pt will Perform Home Exercise Program: with supervision, verbal cues  required/provided PT Goal: Perform Home Exercise Program - Progress: Progressing toward goal  Visit Information  Last PT Received On: 02/12/12 Assistance Needed: +1    Subjective Data  Subjective: I am feeling so much better about this Patient Stated Goal: To walk without pain.   Cognition  Overall Cognitive Status: Appears within functional limits for tasks assessed/performed Arousal/Alertness: Awake/alert Orientation Level: Appears intact for tasks assessed Behavior During Session: [facility] for tasks performed    Balance     End of Session PT - End of Session Activity Tolerance: Patient tolerated treatment well Patient left: in chair;with call bell/phone within reach;with family/visitor present Nurse Communication: Mobility status   GP     Adam Liu 02/12/2012, 4:21 PM

## 2012-02-12 NOTE — Progress Notes (Addendum)
   Subjective: 1 Day Post-Op Procedure(s) (LRB): TOTAL HIP ARTHROPLASTY ANTERIOR APPROACH (Left)   Patient reports pain as mild, pain well controlled. A little nervous about what he will be able to do with the hip, but otherwise doing well. No events throughout the night.  Objective:   VITALS:   Filed Vitals:   02/12/12   BP: 15280  Pulse: 59  Temp: 97.5 F (36.4 C)   Resp: 17    Neurovascular intact Dorsiflexion/Plantar flexion intact Incision: dressing C/D/I No cellulitis present Compartment soft  LABS  Basename 02/12/12 0400  HGB 12.5*  HCT 37.3*  WBC 10.1  PLT 186     Basename 02/12/12 0400  NA 139  K 4.0  BUN 21  CREATININE 0.85  GLUCOSE 119*     Assessment/Plan: 1 Day Post-Op Procedure(s) (LRB): TOTAL HIP ARTHROPLASTY ANTERIOR APPROACH (Left) HV drain d/c'ed Foley cath d/c'ed Advance diet Up with therapy D/C IV fluids Plan for discharge tomorrow to home, if continues to do well.   ABLA  Treated with iron and will observe   Obese (BMI 30-39.9)  Estimated Body mass index is 31.50 kg/(m^2) as calculated from the following:   Height as of this encounter: 6\' 3" (1.905 m).   Weight as of this encounter: 252 lb(114.306 kg). Patient also counseled that weight may inhibit the healing process Patient counseled that losing weight will help with future health issues   Anastasio Auerbach. Chloee Tena   PAC  02/12/2012, 9:05 AM

## 2012-02-13 ENCOUNTER — Encounter (HOSPITAL_COMMUNITY): Payer: Self-pay | Admitting: Orthopedic Surgery

## 2012-02-13 LAB — CBC
HCT: 38.8 % — ABNORMAL LOW (ref 39.0–52.0)
Platelets: 210 10*3/uL (ref 150–400)
RDW: 12.9 % (ref 11.5–15.5)
WBC: 13.6 10*3/uL — ABNORMAL HIGH (ref 4.0–10.5)

## 2012-02-13 LAB — BASIC METABOLIC PANEL
BUN: 16 mg/dL (ref 6–23)
Chloride: 105 mEq/L (ref 96–112)
GFR calc Af Amer: >90 mL/min (ref >90)
Potassium: 4.6 mEq/L (ref 3.5–5.1)

## 2012-02-13 MED ORDER — HYDROCODONE-ACETAMINOPHEN 7.5-325 MG PO TABS: 1.0000 | ORAL_TABLET | ORAL | Status: AC | PRN

## 2012-02-13 MED ORDER — POLYETHYLENE GLYCOL 3350 17 G PO PACK: 17.0000 g | PACK | Freq: Two times a day (BID) | ORAL | Status: AC

## 2012-02-13 MED ORDER — DIPHENHYDRAMINE HCL 25 MG PO CAPS: 25.0000 mg | ORAL_CAPSULE | Freq: Four times a day (QID) | ORAL | Status: DC | PRN

## 2012-02-13 MED ORDER — ASPIRIN EC 325 MG PO TBEC: 325.0000 mg | DELAYED_RELEASE_TABLET | Freq: Two times a day (BID) | ORAL | Status: AC

## 2012-02-13 MED ORDER — DSS 100 MG PO CAPS: 100.0000 mg | ORAL_CAPSULE | Freq: Two times a day (BID) | ORAL | Status: AC

## 2012-02-13 MED ORDER — METHOCARBAMOL 500 MG PO TABS: 500.0000 mg | ORAL_TABLET | Freq: Four times a day (QID) | ORAL | Status: AC | PRN

## 2012-02-13 MED ORDER — FERROUS SULFATE 325 (65 FE) MG PO TABS: 325.0000 mg | ORAL_TABLET | Freq: Three times a day (TID) | ORAL | Status: DC

## 2012-02-13 NOTE — Care Management Note (Signed)
    Page 1 of 2   02/13/2012     12:52:25 PM   CARE MANAGEMENT NOTE 02/13/2012  Patient:  Adam Liu, Adam Liu   Account Number:  [id number]  Date Initiated:  02/12/2012  Documentation initiated by:  Colleen Can  Subjective/Objective Assessment:   dx osteoarthritis left hip; anterior hip replacemnt on day of admission  Referral to [facility] from doctor's office     Action/Plan:   Cm spoke with patient and spouse. Plans are for patient to return to his home in [place] where his pouse will be caregiver. He will need RW and elongated 3n1. Wants [facility] services   Anticipated DC Date:  02/13/2012   Anticipated DC Plan:  HOME W HOME HEALTH SERVICES  In-house referral  NA      DC Planning Services  CM consult      [facility] Choice  HOME HEALTH  DURABLE MEDICAL EQUIPMENT   Choice offered to / List presented to:  C-1 Patient   DME arranged  WALKER - ROLLING  OTHER - SEE COMMENT      DME agency  [facility]  OTHER - SEE NOTE     HH arranged  HH-2 PT      HH agency  [facility]   Status of service:  Completed, signed off Medicare Important Message given?  NO (If response is "NO", the following Medicare IM given date fields will be blank) Date Medicare IM given:   Date Additional Medicare IM given:    Discharge Disposition:  HOME W HOME HEALTH SERVICES  Per UR Regulation:  Reviewed for med. necessity/level of care/duration of stay  If discussed at Long Length of Stay Meetings, dates discussed:    Comments:  02/13/2012 Raynelle Bring BSN CCM [phone] Spoke with Intake at [facility]/faxed face sheet, HH orders. OP note,H&P. PT notes and d/c summary as requested; confirmation received. Services for HHpt will start tomorrow 02/14/2012. RW deliverd to patient's room. DRS has elongated 3N1 and will deliver to patient's home. Pt's spouse notified.

## 2012-02-13 NOTE — Progress Notes (Signed)
   Subjective: 2 Days Post-Op Procedure(s) (LRB): TOTAL HIP ARTHROPLASTY ANTERIOR APPROACH (Left)   Patient reports pain as mild, pain well controlled. No events throughout the night. Very happy with how he feels and the progress to this point. Looking forward to how he feels when fully recovered. Ready to be discharged home.  Objective:   VITALS:   Filed Vitals:   02/13/12 0555  BP: 111/71  Pulse: 61  Temp: 97.7 F (36.5 C)  Resp: 16    Neurovascular intact Dorsiflexion/Plantar flexion intact Incision: dressing C/D/I No cellulitis present Compartment soft  LABS  Basename 02/13/12 0347 02/12/12 0400  HGB 13.2 12.5*  HCT 38.8* 37.3*  WBC 13.6* 10.1  PLT 210 186     Basename 02/13/12 0347 02/12/12 0400  NA 137 139  K 4.6 4.0  BUN 16 21  CREATININE 0.76 0.85  GLUCOSE 130* 119*     Assessment/Plan: 2 Days Post-Op Procedure(s) (LRB): TOTAL HIP ARTHROPLASTY ANTERIOR APPROACH (Left)   Up with therapy Discharge home with home health Follow up in 2 weeks at [facility].  Follow-up Information    Follow up with OLIN,Brendin Situ D in 2 weeks.   Contact information:   [facility] [address] [place] [address] [phone]           Anastasio Auerbach. Tomasz Steeves   PAC  02/13/2012, 7:58 AM

## 2012-02-13 NOTE — Progress Notes (Signed)
Physical Therapy Treatment Patient Details Name: Adam Liu MRN: [id number] DOB: 03/07/1951 Today's Date: 02/13/2012 Time: 4098-1191 PT Time Calculation (min): 43 min  PT Assessment / Plan / Recommendation Comments on Treatment Session  Reviewed car transfers with pt and spouse    Follow Up Recommendations  Home health PT;Supervision/Assistance - 24 hour    Barriers to Discharge        Equipment Recommendations  3 in 1 bedside comode;Rolling walker with 5" wheels    Recommendations for Other Services OT consult  Frequency 7X/week   Plan Discharge plan remains appropriate    Precautions / Restrictions Precautions Precautions: None Precaution Comments: hypotension first time up. Restrictions Weight Bearing Restrictions: No Other Position/Activity Restrictions: WBAT   Pertinent Vitals/Pain 2/10; premedicated    Mobility  Transfers Transfers: Sit to Stand;Stand to Sit Sit to Stand: 5: Supervision Stand to Sit: 5: Supervision Ambulation/Gait Ambulation/Gait Assistance: 5: Supervision Ambulation Distance (Feet): 400 Feet Assistive device: Rolling walker Ambulation/Gait Assistance Details: min cues for position from RW Gait Pattern: Step-through pattern Stairs: Yes Stairs Assistance: 4: Min guard Stair Management Technique: One rail Right;Step to pattern;Forwards;With crutches Number of Stairs: 10     Exercises Total Joint Exercises Ankle Circles/Pumps: AROM;20 reps;Both;Supine Gluteal Sets: AROM;10 reps;Supine;Both Heel Slides: AAROM;Supine;20 reps;Left Hip ABduction/ADduction: AAROM;20 reps;Supine;Left Long Arc Quad: AROM;20 reps;Supine;Both   PT Diagnosis:    PT Problem List:   PT Treatment Interventions:     PT Goals Acute Rehab PT Goals PT Goal Formulation: With patient/family Time For Goal Achievement: 02/25/12 Potential to Achieve Goals: Good Pt will go Supine/Side to Sit: with supervision;with HOB 0 degrees PT Goal: Supine/Side to Sit - Progress:  Progressing toward goal Pt will go Sit to Supine/Side: with supervision;with HOB 0 degrees PT Goal: Sit to Supine/Side - Progress: Progressing toward goal Pt will go Sit to Stand: with supervision PT Goal: Sit to Stand - Progress: Met Pt will go Stand to Sit: with supervision PT Goal: Stand to Sit - Progress: Met Pt will Ambulate: 51 - 150 feet;with least restrictive assistive device;with supervision PT Goal: Ambulate - Progress: Met Pt will Go Up / Down Stairs: 6-9 stairs;with min assist;with least restrictive assistive device;3-5 stairs PT Goal: Up/Down Stairs - Progress: Met Pt will Perform Home Exercise Program: with supervision, verbal cues required/provided PT Goal: Perform Home Exercise Program - Progress: Progressing toward goal  Visit Information  Last PT Received On: 02/13/12 Assistance Needed: +1    Subjective Data  Subjective: I am doing better every day Patient Stated Goal: To walk without pain.   Cognition  Overall Cognitive Status: Appears within functional limits for tasks assessed/performed Arousal/Alertness: Awake/alert Orientation Level: Appears intact for tasks assessed Behavior During Session: [facility] for tasks performed    Balance     End of Session PT - End of Session Activity Tolerance: Patient tolerated treatment well Patient left: in chair;with call bell/phone within reach;with family/visitor present Nurse Communication: Mobility status   GP     Raziya Aveni 02/13/2012, 11:39 AM

## 2012-02-13 NOTE — Discharge Summary (Signed)
Physician Discharge Summary  Patient ID: Adam Liu MRN: [id number] DOB/AGE: 1951-04-24 61 y.o.  Admit date: 02/11/2012 Discharge date:  02/13/2012  Procedures:  Procedure(s) (LRB): TOTAL HIP ARTHROPLASTY ANTERIOR APPROACH (Left)  Attending Physician:  Dr. Durene Liu   Admission Diagnoses:   Left hip OA and pain   Discharge Diagnoses:  Principal Problem:  *S/P left THA, AA Active Problems:  Obese  Expected blood loss anemia   HPI: Pt is a 61 y.o. male complaining of left hip pain for 1+ years, denies any trauma or incident. Pain had continually increased since the beginning. X-rays in the clinic show end-stage arthritic changes of the left hip. Pt has tried various conservative treatments which have failed to alleviate their symptoms. Various options are discussed with the patient. Risks, benefits and expectations were discussed with the patient. Patient understand the risks, benefits and expectations and wishes to proceed with surgery.   PCP: No primary provider on file.   Discharged Condition: good  Hospital Course:  Patient underwent the above stated procedure on 02/11/2012. Patient tolerated the procedure well and brought to the recovery room in good condition and subsequently to the floor.  POD #1 BP: 152/80 ; Pulse: 59 ; Temp: 97.5 F (36.4 C) ; Resp: 17  Pt's foley was removed, as well as the hemovac drain removed. IV was changed to a saline lock. Patient reports pain as mild, pain well controlled. A little nervous about what he will be able to do with the hip, but otherwise doing well. No events throughout the night. Neurovascular intact, dorsiflexion/plantar flexion intact, incision: dressing C/D/I, no cellulitis present and compartment soft.   LABS  Basename  02/12/12 0400   HGB  12.5  HCT  37.3   POD #2  BP: 111/71 ; Pulse: 61 ; Temp: 97.7 F (36.5 C) ; Resp: 16  Patient reports pain as mild, pain well controlled. No events throughout the night. Very  happy with how he feels and the progress to this point. Looking forward to how he feels when fully recovered. Ready to be discharged home. Neurovascular intact, dorsiflexion/plantar flexion intact, incision: dressing C/D/I, no cellulitis present and compartment soft.   LABS  Basename  02/13/12 0347   HGB  13.2  HCT  38.8    Discharge Exam: General appearance: alert, cooperative and no distress Extremities: Homans sign is negative, no sign of DVT, no edema, redness or tenderness in the calves or thighs and no ulcers, gangrene or trophic changes  Disposition:  Home with follow up in 2 weeks   Follow-up Information    Follow up with Adam Pal, MD in 2 weeks.   Contact information:   [facility] [address], [address] [place] [address] [phone]          Discharge Orders    Future Orders Please Complete By Expires   Diet - low sodium heart healthy      Call MD / Call 911      Comments:   If you experience chest pain or shortness of breath, CALL 911 and be transported to the hospital emergency room.  If you develope a fever above 101 F, pus (white drainage) or increased drainage or redness at the wound, or calf pain, call your surgeon's office.   Discharge instructions      Comments:   Maintain surgical dressing for 8 days, then replace with gauze and tape. Keep the area dry and clean until follow up. Follow up in  2 weeks at [facility]. Call with any questions or concerns.   Constipation Prevention      Comments:   Drink plenty of fluids.  Prune juice may be helpful.  You may use a stool softener, such as Colace (over the counter) 100 mg twice a day.  Use MiraLax (over the counter) for constipation as needed.   Increase activity slowly as tolerated      Change dressing      Comments:   Maintain surgical dressing for 8 days, then replace with 4x4 guaze and tape. Keep the area dry and clean.   TED hose      Comments:    Use stockings (TED hose) for 2 weeks on both leg(s).  You may remove them at night for sleeping.      Current Discharge Medication List    START taking these medications   Details  aspirin EC 325 MG tablet Take 1 tablet (325 mg total) by mouth 2 (two) times daily. X 4 weeks Qty: 60 tablet, Refills: 0    diphenhydrAMINE (BENADRYL) 25 mg capsule Take 1 capsule (25 mg total) by mouth every 6 (six) hours as needed for itching, allergies or sleep. Qty: 30 capsule    docusate sodium 100 MG CAPS Take 100 mg by mouth 2 (two) times daily. Qty: 10 capsule    ferrous sulfate 325 (65 FE) MG tablet Take 1 tablet (325 mg total) by mouth 3 (three) times daily after meals.    HYDROcodone-acetaminophen (NORCO) 7.5-325 MG per tablet Take 1-2 tablets by mouth every 4 (four) hours as needed for pain. Qty: 100 tablet, Refills: 0    methocarbamol (ROBAXIN) 500 MG tablet Take 1 tablet (500 mg total) by mouth every 6 (six) hours as needed (muscle spasms).    polyethylene glycol (MIRALAX / GLYCOLAX) packet Take 17 g by mouth 2 (two) times daily. Qty: 14 each         Signed: Anastasio Liu. Adam Liu   PAC  02/13/2012, 8:28 AM

## 2013-02-23 ENCOUNTER — Ambulatory Visit: Payer: 59 | Admitting: Internal Medicine

## 2013-03-04 ENCOUNTER — Encounter: Payer: Self-pay | Admitting: Infectious Disease

## 2013-03-04 ENCOUNTER — Ambulatory Visit (INDEPENDENT_AMBULATORY_CARE_PROVIDER_SITE_OTHER): Payer: 59 | Admitting: Infectious Disease

## 2013-03-04 VITALS — BP 153/87 | HR 61 | Temp 98.4°F | Ht 75.0 in | Wt 241.0 lb

## 2013-03-04 DIAGNOSIS — Z113 Encounter for screening for infections with a predominantly sexual mode of transmission: Secondary | ICD-10-CM

## 2013-03-04 DIAGNOSIS — L738 Other specified follicular disorders: Secondary | ICD-10-CM

## 2013-03-04 DIAGNOSIS — C4491 Basal cell carcinoma of skin, unspecified: Secondary | ICD-10-CM | POA: Insufficient documentation

## 2013-03-04 DIAGNOSIS — B957 Other staphylococcus as the cause of diseases classified elsewhere: Secondary | ICD-10-CM

## 2013-03-04 DIAGNOSIS — B9689 Other specified bacterial agents as the cause of diseases classified elsewhere: Secondary | ICD-10-CM

## 2013-03-04 DIAGNOSIS — A498 Other bacterial infections of unspecified site: Secondary | ICD-10-CM

## 2013-03-04 DIAGNOSIS — L739 Follicular disorder, unspecified: Secondary | ICD-10-CM

## 2013-03-04 LAB — COMPLETE METABOLIC PANEL WITH GFR
AST: 15 U/L (ref 0–37)
Albumin: 4.3 g/dL (ref 3.5–5.2)
BUN: 17 mg/dL (ref 6–23)
Calcium: 9.2 mg/dL (ref 8.4–10.5)
Chloride: 104 mEq/L (ref 96–112)
GFR, Est Non African American: >89 mL/min
Glucose, Bld: 82 mg/dL (ref 70–99)
Potassium: 4.3 mEq/L (ref 3.5–5.3)

## 2013-03-04 LAB — CBC WITH DIFFERENTIAL/PLATELET
HCT: 47.1 % (ref 39.0–52.0)
Hemoglobin: 16.4 g/dL (ref 13.0–17.0)
Lymphocytes Relative: 24 % (ref 12–46)
Lymphs Abs: 1.6 10*3/uL (ref 0.7–4.0)
Monocytes Absolute: 0.6 10*3/uL (ref 0.1–1.0)
Monocytes Relative: 9 % (ref 3–12)
Neutro Abs: 4.5 10*3/uL (ref 1.7–7.7)
WBC: 6.7 10*3/uL (ref 4.0–10.5)

## 2013-03-04 MED ORDER — CLINDAMYCIN PHOSPHATE 1 % EX SOLN: Freq: Two times a day (BID) | CUTANEOUS | Status: DC

## 2013-03-04 MED ORDER — SULFAMETHOXAZOLE-TMP DS 800-160 MG PO TABS: 1.0000 | ORAL_TABLET | Freq: Two times a day (BID) | ORAL | Status: DC

## 2013-03-04 NOTE — Progress Notes (Signed)
Subjective:    Patient ID: Adam Liu, male    DOB: 1950/10/19, 62 y.o.   MRN: [id number]  Rash Pertinent negatives include no congestion, cough, diarrhea, fatigue, fever, rhinorrhea, shortness of breath, sore throat or vomiting.    Adam Liu is a 62 year old and with a history of basal cell carcinoma that was removed. Along the way he developed severe irritation along his scalp. He has erythema burning and stinging sensation. This has been seen by Dr. Terri Piedra with dermatology. He has cultured this area on 2 different occasions in June and August of 2014. In June he grew enterococcus Aerogenes that was resistant to cefazolin cefoxitin but sensitive to cefazolin ceftriaxone cefepime imipenem ertapenem ciprofloxacin levofloxacin bypass circuit gentamicin and tobramycin as well as Bactrim. He also grew a coag was negative staph species which was resistant to oxacillin cefazolin resistant to clindamycin and sensitive to vancomycin Bactrim linezolid rifampin and tetracycline. Cultures in August again grew Enterobacter species with same sensitivities namely being resistant to cefazolin cefoxitin but otherwise sensitive. His scalp lesions have responded quite nicely to clindamycin arguing that neither of the above mentioned pathogens are playing a role in his skin pathology. Typically applying clindamycin lotion will result in resolution of his dermatitis and folliculitis. He also has been taking courses of ciprofloxacin which seemed to help as well. And finally he took a course of Bactrim along the way.  He has no known immunological conditions and has not suffered from recurrent infections until this recent skin condition erupted a few years ago. He has not had a decolonization regimen attempted so far.  Review of Systems  Constitutional: Negative for fever, chills, diaphoresis, activity change, appetite change, fatigue and unexpected weight change.  HENT: Negative for congestion, sore throat,  rhinorrhea, sneezing, trouble swallowing and sinus pressure.   Eyes: Negative for photophobia and visual disturbance.  Respiratory: Negative for cough, chest tightness, shortness of breath, wheezing and stridor.   Cardiovascular: Negative for chest pain, palpitations and leg swelling.  Gastrointestinal: Negative for nausea, vomiting, abdominal pain, diarrhea, constipation, blood in stool, abdominal distention and anal bleeding.  Genitourinary: Negative for dysuria, hematuria, flank pain and difficulty urinating.  Musculoskeletal: Negative for myalgias, back pain, joint swelling, arthralgias and gait problem.  Skin: Positive for rash. Negative for color change, pallor and wound.  Neurological: Negative for dizziness, tremors, weakness and light-headedness.  Hematological: Negative for adenopathy. Does not bruise/bleed easily.  Psychiatric/Behavioral: Negative for behavioral problems, confusion, sleep disturbance, dysphoric mood, decreased concentration and agitation.       Objective:   Physical Exam  Constitutional: He is oriented to person, place, and time. He appears well-developed and well-nourished. No distress.  HENT:  Head: Normocephalic and atraumatic.    Mouth/Throat: Oropharynx is clear and moist. No oropharyngeal exudate.  Eyes: Conjunctivae and EOM are normal. Pupils are equal, round, and reactive to light. No scleral icterus.  Neck: Normal range of motion. Neck supple. No JVD present.  Cardiovascular: Normal rate, regular rhythm and normal heart sounds.  Exam reveals no gallop and no friction rub.   No murmur heard. Pulmonary/Chest: Effort normal and breath sounds normal. No respiratory distress. He has no wheezes. He has no rales. He exhibits no tenderness.  Abdominal: He exhibits no distension and no mass. There is no tenderness. There is no rebound and no guarding.  Musculoskeletal: He exhibits no edema and no tenderness.  Lymphadenopathy:    He has no cervical adenopathy.   Neurological: He is alert and oriented to person, place,  and time. He has normal reflexes. He exhibits normal muscle tone. Coordination normal.  Skin: Skin is warm and dry. He is not diaphoretic. No erythema. No pallor.  Psychiatric: He has a normal mood and affect. His behavior is normal. Judgment and thought content normal.          Assessment & Plan:   #1 Dermatitis, folliculitis: this would seem likely to be do to either strep species or staph species given the responsiveness to clindamycin which would not be active against Enterobacter nor active against his particular coagulase negative Staphylococcus species. We will place him on Bactrim twice daily  And allowed to continue the clindamycin lotion. I like to treat him for at least 2 months with the Bactrim. I will bring him back in 6 weeks to see how he is progressing. Once he is free of lesions we'll attempt a decolonization regimen with topical Hibiclens baths and intranasal mupirocin for the patient undertake and consider also having his spouse undertaken in case she has a asymptomatic, colonized patient  #2 STD screening: will test for HIV per recommendations of [facility]. He is low risk demographic  #3 basal cell: removed

## 2013-03-05 LAB — HIV ANTIBODY (ROUTINE TESTING W REFLEX): HIV: NONREACTIVE

## 2013-03-09 ENCOUNTER — Ambulatory Visit: Payer: 59 | Admitting: Infectious Disease

## 2013-04-14 ENCOUNTER — Ambulatory Visit: Payer: 59 | Admitting: Infectious Disease

## 2013-05-17 ENCOUNTER — Ambulatory Visit: Payer: 59 | Admitting: Infectious Disease

## 2013-05-26 ENCOUNTER — Other Ambulatory Visit: Payer: Self-pay | Admitting: Infectious Disease

## 2013-06-23 ENCOUNTER — Ambulatory Visit (INDEPENDENT_AMBULATORY_CARE_PROVIDER_SITE_OTHER): Payer: 59 | Admitting: Infectious Disease

## 2013-06-23 ENCOUNTER — Encounter: Payer: Self-pay | Admitting: Infectious Disease

## 2013-06-23 VITALS — BP 169/76 | HR 53 | Temp 97.5°F | Wt 253.0 lb

## 2013-06-23 DIAGNOSIS — B957 Other staphylococcus as the cause of diseases classified elsewhere: Secondary | ICD-10-CM

## 2013-06-23 DIAGNOSIS — L0291 Cutaneous abscess, unspecified: Secondary | ICD-10-CM

## 2013-06-23 DIAGNOSIS — L0390 Cellulitis, unspecified: Secondary | ICD-10-CM

## 2013-06-23 DIAGNOSIS — B9689 Other specified bacterial agents as the cause of diseases classified elsewhere: Secondary | ICD-10-CM

## 2013-06-23 DIAGNOSIS — Z113 Encounter for screening for infections with a predominantly sexual mode of transmission: Secondary | ICD-10-CM

## 2013-06-23 DIAGNOSIS — C4491 Basal cell carcinoma of skin, unspecified: Secondary | ICD-10-CM

## 2013-06-23 DIAGNOSIS — A498 Other bacterial infections of unspecified site: Secondary | ICD-10-CM

## 2013-06-23 MED ORDER — MUPIROCIN 2 % EX OINT: 1.0000 "application " | TOPICAL_OINTMENT | Freq: Two times a day (BID) | CUTANEOUS | Status: DC

## 2013-06-23 MED ORDER — CHLORHEXIDINE GLUCONATE 4 % EX LIQD: CUTANEOUS | Status: DC

## 2013-06-23 NOTE — Progress Notes (Signed)
Subjective:    Patient ID: Adam Liu, male    DOB: September 21, 1950, 63 y.o.   MRN: [id number]  HPI  Adam Liu is a 63 year old and with a history of basal cell carcinoma that was removed. Along the way he developed severe irritation along his scalp. He has erythema burning and stinging sensation. This has been seen by Dr. Allyson Sabal with dermatology. He has cultured this area on 2 different occasions in June and August of 2014. In June he grew enterococcus Aerogenes that was resistant to cefazolin cefoxitin but sensitive to cefazolin ceftriaxone cefepime imipenem ertapenem ciprofloxacin levofloxacin bypass circuit gentamicin and tobramycin as well as Bactrim. He also grew a coag was negative staph species which was resistant to oxacillin cefazolin resistant to clindamycin and sensitive to vancomycin Bactrim linezolid rifampin and tetracycline. Cultures in August again grew Enterobacter species with same sensitivities namely being resistant to cefazolin cefoxitin but otherwise sensitive. His scalp lesions have responded quite nicely to clindamycin arguing that neither of the above mentioned pathogens are playing a role in his skin pathology. Typically applying clindamycin lotion will result in resolution of his dermatitis and folliculitis. He also has been taking courses of ciprofloxacin which seemed to help as well. And finally he took a course of Bactrim along the way.  I gave him a several month course of oral bactrim with complete resolution of his scalp lesions. He was doing well but then had recurrence of erythema behind her right ear. This has responded to topical hydrogen peroxide and benzoyl peroxide. He has not gone back on oral antibiotics.  We discussed the idea of putting on protracted doxycycline or minocycline but he did not want to be on protracted antibiotics we also discussed the option of putting him on a decolonization regimen he was open to doing this.  Review of Systems    Constitutional: Negative for chills, diaphoresis, activity change, appetite change and unexpected weight change.  HENT: Negative for sinus pressure, sneezing and trouble swallowing.   Eyes: Negative for photophobia and visual disturbance.  Respiratory: Negative for chest tightness, wheezing and stridor.   Cardiovascular: Negative for chest pain, palpitations and leg swelling.  Gastrointestinal: Negative for nausea, abdominal pain, constipation, blood in stool, abdominal distention and anal bleeding.  Genitourinary: Negative for dysuria, hematuria, flank pain and difficulty urinating.  Musculoskeletal: Negative for arthralgias, back pain, gait problem, joint swelling and myalgias.  Skin: Positive for rash. Negative for color change, pallor and wound.  Neurological: Negative for dizziness, tremors, weakness and light-headedness.  Hematological: Negative for adenopathy. Does not bruise/bleed easily.  Psychiatric/Behavioral: Negative for behavioral problems, confusion, sleep disturbance, dysphoric mood, decreased concentration and agitation.       Objective:   Physical Exam  Constitutional: He is oriented to person, place, and time. He appears well-developed and well-nourished. No distress.  HENT:  Head: Normocephalic and atraumatic.    Mouth/Throat: Oropharynx is clear and moist. No oropharyngeal exudate.  Eyes: Conjunctivae and EOM are normal. Pupils are equal, round, and reactive to light. No scleral icterus.  Neck: Normal range of motion. Neck supple. No JVD present.  Cardiovascular: Normal rate, regular rhythm and normal heart sounds.  Exam reveals no gallop and no friction rub.   No murmur heard. Pulmonary/Chest: Effort normal and breath sounds normal. No respiratory distress. He has no wheezes. He has no rales. He exhibits no tenderness.  Abdominal: He exhibits no distension and no mass. There is no tenderness. There is no rebound and no guarding.  Musculoskeletal: He exhibits  no  edema and no tenderness.  Lymphadenopathy:    He has no cervical adenopathy.  Neurological: He is alert and oriented to person, place, and time. He has normal reflexes. He exhibits normal muscle tone. Coordination normal.  Skin: Skin is warm and dry. He is not diaphoretic. No erythema. No pallor.  Psychiatric: He has a normal mood and affect. His behavior is normal. Judgment and thought content normal.            Assessment & Plan:   #1 Dermatitis, folliculitis: this would seem likely to be do to either strep species or staph species given the responsiveness to clindamycin which would not be active against Enterobacter nor active against his particular coagulase negative Staphylococcus species.   He does not like the idea of protracted oral antibiotics due to his concerns about its effects on his fecal microbiology. Given that fact I suggested that if we went down that route but perhaps a course of doxycycline versus minocycline might be better since it has a more narrow therapeutic range and lower risk for causing Clostridium difficile colitis.  For now he didn't want to do this but instead would prefer to continue with topical agents.  I suggested that we dio try a conization regimen with Hibiclens and mupirocin x7 days  I spent greater than 25 minutes with the patient including greater than 50% of time in face to face counsel of the patient and in coordination of their care.  #2 STD screening:  HIV negative  #3 basal cell: removed  #4 Need for flu vaccine: he again refused

## 2013-10-25 ENCOUNTER — Ambulatory Visit: Payer: 59 | Admitting: Infectious Disease

## 2016-01-24 ENCOUNTER — Ambulatory Visit: Payer: Self-pay | Admitting: Podiatry

## 2016-03-12 ENCOUNTER — Encounter (HOSPITAL_COMMUNITY): Payer: Self-pay

## 2016-03-12 ENCOUNTER — Emergency Department (HOSPITAL_COMMUNITY): Payer: PPO | Admitting: Anesthesiology

## 2016-03-12 ENCOUNTER — Inpatient Hospital Stay (HOSPITAL_COMMUNITY)
Admission: EM | Admit: 2016-03-12 | Discharge: 2016-03-15 | DRG: 558 | Disposition: A | Discharge status: Home Health | Payer: PPO | Attending: Urology | Admitting: Urology

## 2016-03-12 ENCOUNTER — Emergency Department (HOSPITAL_COMMUNITY): Payer: PPO

## 2016-03-12 ENCOUNTER — Encounter (HOSPITAL_COMMUNITY)
Admission: EM | Disposition: A | Discharge status: Home Health | Payer: Self-pay | Source: Home / Self Care | Attending: Urology

## 2016-03-12 DIAGNOSIS — M726 Necrotizing fasciitis: Principal | ICD-10-CM | POA: Diagnosis present

## 2016-03-12 DIAGNOSIS — Z85828 Personal history of other malignant neoplasm of skin: Secondary | ICD-10-CM | POA: Diagnosis not present

## 2016-03-12 DIAGNOSIS — N492 Inflammatory disorders of scrotum: Secondary | ICD-10-CM | POA: Diagnosis present

## 2016-03-12 DIAGNOSIS — Z96642 Presence of left artificial hip joint: Secondary | ICD-10-CM | POA: Diagnosis present

## 2016-03-12 DIAGNOSIS — E872 Acidosis: Secondary | ICD-10-CM | POA: Diagnosis not present

## 2016-03-12 HISTORY — PX: INCISION AND DRAINAGE ABSCESS: SHX5864

## 2016-03-12 LAB — BASIC METABOLIC PANEL
ANION GAP: 10 (ref 5–15)
BUN: 25 mg/dL — ABNORMAL HIGH (ref 6–20)
CALCIUM: 8.6 mg/dL — AB (ref 8.9–10.3)
CHLORIDE: 100 mmol/L — AB (ref 101–111)
CO2: 22 mmol/L (ref 22–32)
Creatinine, Ser: 0.88 mg/dL (ref 0.61–1.24)
GFR calc non Af Amer: >60 mL/min (ref >60)
GLUCOSE: 111 mg/dL — AB (ref 65–99)
Potassium: 3.8 mmol/L (ref 3.5–5.1)
Sodium: 132 mmol/L — ABNORMAL LOW (ref 135–145)

## 2016-03-12 LAB — CBC WITH DIFFERENTIAL/PLATELET
BASOS ABS: 0 10*3/uL (ref 0.0–0.1)
BASOS PCT: 0 %
EOS ABS: 0 10*3/uL (ref 0.0–0.7)
Eosinophils Relative: 0 %
HEMATOCRIT: 48.1 % (ref 39.0–52.0)
HEMOGLOBIN: 16.5 g/dL (ref 13.0–17.0)
Lymphocytes Relative: 9 %
Lymphs Abs: 1.6 10*3/uL (ref 0.7–4.0)
MCH: 31.8 pg (ref 26.0–34.0)
MCHC: 34.3 g/dL (ref 30.0–36.0)
MCV: 92.7 fL (ref 78.0–100.0)
Monocytes Absolute: 1.6 10*3/uL — ABNORMAL HIGH (ref 0.1–1.0)
Monocytes Relative: 9 %
NEUTROS ABS: 14.2 10*3/uL — AB (ref 1.7–7.7)
NEUTROS PCT: 82 %
Platelets: 198 10*3/uL (ref 150–400)
RBC: 5.19 MIL/uL (ref 4.22–5.81)
RDW: 13.5 % (ref 11.5–15.5)
WBC: 17.4 10*3/uL — ABNORMAL HIGH (ref 4.0–10.5)

## 2016-03-12 LAB — I-STAT CG4 LACTIC ACID, ED: LACTIC ACID, VENOUS: 2.38 mmol/L — AB (ref 0.5–1.9)

## 2016-03-12 LAB — URINE MICROSCOPIC-ADD ON: RBC / HPF: NONE SEEN RBC/hpf (ref 0–5)

## 2016-03-12 LAB — URINALYSIS, ROUTINE W REFLEX MICROSCOPIC
Glucose, UA: NEGATIVE mg/dL
Hgb urine dipstick: NEGATIVE
KETONES UR: 40 mg/dL — AB
NITRITE: POSITIVE — AB
PROTEIN: 30 mg/dL — AB
Specific Gravity, Urine: 1.034 — ABNORMAL HIGH (ref 1.005–1.030)
pH: 5.5 (ref 5.0–8.0)

## 2016-03-12 SURGERY — INCISION AND DRAINAGE, ABSCESS
Anesthesia: General

## 2016-03-12 MED ORDER — SODIUM CHLORIDE 0.9 % IV SOLN
INTRAVENOUS | Status: DC
  Administered 2016-03-12 – 2016-03-14 (×4): via INTRAVENOUS

## 2016-03-12 MED ORDER — BUPIVACAINE-EPINEPHRINE 0.5% -1:200000 IJ SOLN
INTRAMUSCULAR | Status: AC
  Filled 2016-03-12: qty 1

## 2016-03-12 MED ORDER — PIPERACILLIN-TAZOBACTAM 3.375 G IVPB 30 MIN
3.3750 g | Freq: Once | INTRAVENOUS | Status: AC
  Administered 2016-03-12: 3.375 g via INTRAVENOUS
  Filled 2016-03-12: qty 50

## 2016-03-12 MED ORDER — VANCOMYCIN HCL 10 G IV SOLR
2000.0000 mg | INTRAVENOUS | Status: AC
  Administered 2016-03-12: 2000 mg via INTRAVENOUS
  Filled 2016-03-12: qty 2000

## 2016-03-12 MED ORDER — FENTANYL CITRATE (PF) 100 MCG/2ML IJ SOLN: 25.0000 ug | INTRAMUSCULAR | Status: DC | PRN

## 2016-03-12 MED ORDER — IOPAMIDOL (ISOVUE-300) INJECTION 61%
100.0000 mL | Freq: Once | INTRAVENOUS | Status: AC | PRN
  Administered 2016-03-12: 100 mL via INTRAVENOUS

## 2016-03-12 MED ORDER — FENTANYL CITRATE (PF) 100 MCG/2ML IJ SOLN
INTRAMUSCULAR | Status: AC
  Filled 2016-03-12: qty 2

## 2016-03-12 MED ORDER — SENNOSIDES-DOCUSATE SODIUM 8.6-50 MG PO TABS
1.0000 | ORAL_TABLET | Freq: Two times a day (BID) | ORAL | Status: DC
  Administered 2016-03-13 – 2016-03-15 (×3): 1 via ORAL
  Filled 2016-03-12 (×5): qty 1

## 2016-03-12 MED ORDER — MIDAZOLAM HCL 5 MG/5ML IJ SOLN
INTRAMUSCULAR | Status: DC | PRN
  Administered 2016-03-12: 2 mg via INTRAVENOUS

## 2016-03-12 MED ORDER — PROPOFOL 10 MG/ML IV BOLUS
INTRAVENOUS | Status: AC
  Filled 2016-03-12: qty 20

## 2016-03-12 MED ORDER — ONDANSETRON HCL 4 MG/2ML IJ SOLN
INTRAMUSCULAR | Status: DC | PRN
  Administered 2016-03-12: 4 mg via INTRAVENOUS

## 2016-03-12 MED ORDER — PROPOFOL 10 MG/ML IV BOLUS
INTRAVENOUS | Status: DC | PRN
  Administered 2016-03-12: 180 mg via INTRAVENOUS

## 2016-03-12 MED ORDER — FENTANYL CITRATE (PF) 100 MCG/2ML IJ SOLN
INTRAMUSCULAR | Status: DC | PRN
  Administered 2016-03-12 (×2): 50 ug via INTRAVENOUS

## 2016-03-12 MED ORDER — PROMETHAZINE HCL 25 MG/ML IJ SOLN: 6.2500 mg | INTRAMUSCULAR | Status: DC | PRN

## 2016-03-12 MED ORDER — VANCOMYCIN HCL IN DEXTROSE 750-5 MG/150ML-% IV SOLN
750.0000 mg | Freq: Two times a day (BID) | INTRAVENOUS | Status: DC
  Administered 2016-03-13 – 2016-03-15 (×5): 750 mg via INTRAVENOUS
  Filled 2016-03-12 (×6): qty 150

## 2016-03-12 MED ORDER — BUPIVACAINE-EPINEPHRINE 0.5% -1:200000 IJ SOLN
INTRAMUSCULAR | Status: DC | PRN
  Administered 2016-03-12: 50 mL

## 2016-03-12 MED ORDER — LIDOCAINE 2% (20 MG/ML) 5 ML SYRINGE
INTRAMUSCULAR | Status: DC | PRN
  Administered 2016-03-12: 100 mg via INTRAVENOUS

## 2016-03-12 MED ORDER — HYDROMORPHONE HCL 1 MG/ML IJ SOLN
0.5000 mg | INTRAMUSCULAR | Status: DC | PRN
Start: 2016-03-12 — End: 2016-03-15
  Administered 2016-03-14: 0.5 mg via INTRAVENOUS
  Filled 2016-03-12 (×2): qty 1

## 2016-03-12 MED ORDER — PIPERACILLIN-TAZOBACTAM 3.375 G IVPB
3.3750 g | Freq: Three times a day (TID) | INTRAVENOUS | Status: DC
  Administered 2016-03-13 – 2016-03-15 (×8): 3.375 g via INTRAVENOUS
  Filled 2016-03-12 (×6): qty 50

## 2016-03-12 MED ORDER — MIDAZOLAM HCL 2 MG/2ML IJ SOLN
INTRAMUSCULAR | Status: AC
  Filled 2016-03-12: qty 2

## 2016-03-12 MED ORDER — OXYCODONE HCL 5 MG PO TABS
5.0000 mg | ORAL_TABLET | ORAL | Status: DC | PRN
  Administered 2016-03-13 – 2016-03-15 (×2): 5 mg via ORAL
  Filled 2016-03-12 (×3): qty 1

## 2016-03-12 MED ORDER — SUCCINYLCHOLINE CHLORIDE 200 MG/10ML IV SOSY
PREFILLED_SYRINGE | INTRAVENOUS | Status: DC | PRN
  Administered 2016-03-12: 100 mg via INTRAVENOUS

## 2016-03-12 SURGICAL SUPPLY — 20 items
BANDAGE ELASTIC 4 LF NS (GAUZE/BANDAGES/DRESSINGS) ×3 IMPLANT
BANDAGE ELASTIC 4 VELCRO ST LF (GAUZE/BANDAGES/DRESSINGS) ×3 IMPLANT
DRSG PAD ABDOMINAL 8X10 ST (GAUZE/BANDAGES/DRESSINGS) ×3 IMPLANT
ELECT PENCIL ROCKER SW 15FT (MISCELLANEOUS) ×3 IMPLANT
ELECT REM PT RETURN 9FT ADLT (ELECTROSURGICAL) ×3
ELECTRODE REM PT RTRN 9FT ADLT (ELECTROSURGICAL) ×1 IMPLANT
GLOVE BIOGEL M 8.0 STRL (GLOVE) ×3 IMPLANT
GOWN L4 LG 24 PK N/S (GOWN DISPOSABLE) ×3 IMPLANT
GOWN STRL REUS W/ TWL LRG LVL3 (GOWN DISPOSABLE) ×1 IMPLANT
GOWN STRL REUS W/TWL LRG LVL3 (GOWN DISPOSABLE) ×2
NDL SAFETY ECLIPSE 18X1.5 (NEEDLE) ×1 IMPLANT
NEEDLE HYPO 18GX1.5 SHARP (NEEDLE) ×2
PACK BASIC VI WITH GOWN DISP (CUSTOM PROCEDURE TRAY) ×3 IMPLANT
SPONGE LAP 18X18 X RAY DECT (DISPOSABLE) ×3 IMPLANT
SUCTION YANKAUER HANDLE (MISCELLANEOUS) ×3 IMPLANT
SUT PROLENE 1 CT 1 30 (SUTURE) ×6 IMPLANT
SYR BULB IRRIGATION 50ML (SYRINGE) ×3 IMPLANT
SYR CONTROL 10ML LL (SYRINGE) ×3 IMPLANT
TOWEL NATURAL 10PK STERILE (DISPOSABLE) ×6 IMPLANT
YANKAUER SUCT BULB TIP NO VENT (SUCTIONS) ×3 IMPLANT

## 2016-03-12 NOTE — Transfer of Care (Signed)
Immediate Anesthesia Transfer of Care Note  Patient: Adam Liu  Procedure(s) Performed: Procedure(s): INCISION AND DRAINAGE ABSCESS (N/A)  Patient Location: PACU  Anesthesia Type:General  Level of Consciousness: awake, alert  and oriented  Airway & Oxygen Therapy: Patient Spontanous Breathing and Patient connected to face mask oxygen  Post-op Assessment: Report given to RN and Post -op Vital signs reviewed and stable  Post vital signs: Reviewed and stable  Last Vitals:  Vitals:   03/12/16 1726 03/12/16 1936  BP: 145/59 156/81  Pulse: 75 76  Resp: 17 18  Temp: 37 C     Last Pain:  Vitals:   03/12/16 1726  TempSrc: Oral  PainSc:          Complications: No apparent anesthesia complications

## 2016-03-12 NOTE — Brief Op Note (Signed)
03/12/2016  10:14 PM  PATIENT:  Leotis Bracey  65 y.o. male  PRE-OPERATIVE DIAGNOSIS:  scrotal abscess  POST-OPERATIVE DIAGNOSIS:  scrotal abscess  PROCEDURE:  Procedure(s): INCISION AND DRAINAGE ABSCESS (N/A)  SURGEON:  Surgeon(s) and Role:    * Alexis Frock, MD - Primary  PHYSICIAN ASSISTANT:   ASSISTANTS: none   ANESTHESIA:   local and general  EBL:  Total I/O In: -  Out: 10 [Blood:10]  BLOOD ADMINISTERED:none  DRAINS: none   LOCAL MEDICATIONS USED:  MARCAINE     SPECIMEN:  Source of Specimen:  1 - abscess gram stain and Cx, 2 - tissue CX  DISPOSITION OF SPECIMEN:  microbiology  COUNTS:  YES  TOURNIQUET:  * No tourniquets in log *  DICTATION: .Other Dictation: Dictation Number [id number]  PLAN OF CARE: Admit to inpatient   PATIENT DISPOSITION:  PACU - hemodynamically stable.   Delay start of Pharmacological VTE agent (>24hrs) due to surgical blood loss or risk of bleeding: yes

## 2016-03-12 NOTE — ED Notes (Signed)
MD made aware of critical lactic value

## 2016-03-12 NOTE — ED Notes (Signed)
Patient transported to CT 

## 2016-03-12 NOTE — ED Triage Notes (Addendum)
Pt referred here from [facility] w/o left scrotum 5/10 pain/swelling. Pt reports pain began x2 days ago. Pt denies dysuria. Pt A+OX4.

## 2016-03-12 NOTE — Anesthesia Preprocedure Evaluation (Signed)
Anesthesia Evaluation  Patient identified by MRN, date of birth, ID band Patient awake    Reviewed: Allergy & Precautions, NPO status , Patient's Chart, lab work & pertinent test results  Airway Mallampati: II  TM Distance: >3 FB Neck ROM: Full    Dental no notable dental hx.    Pulmonary neg pulmonary ROS,    Pulmonary exam normal breath sounds clear to auscultation       Cardiovascular negative cardio ROS Normal cardiovascular exam Rhythm:Regular Rate:Normal     Neuro/Psych negative neurological ROS  negative psych ROS   GI/Hepatic negative GI ROS, Neg liver ROS,   Endo/Other  negative endocrine ROS  Renal/GU negative Renal ROS  negative genitourinary   Musculoskeletal  (+) Arthritis ,   Abdominal   Peds negative pediatric ROS (+)  Hematology  (+) anemia ,   Anesthesia Other Findings   Reproductive/Obstetrics negative OB ROS                             Anesthesia Physical Anesthesia Plan  ASA: II  Anesthesia Plan: General   Post-op Pain Management:    Induction: Intravenous  Airway Management Planned: Oral ETT  Additional Equipment:   Intra-op Plan:   Post-operative Plan: Extubation in OR  Informed Consent: I have reviewed the patients History and Physical, chart, labs and discussed the procedure including the risks, benefits and alternatives for the proposed anesthesia with the patient or authorized representative who has indicated his/her understanding and acceptance.   Dental advisory given  Plan Discussed with: CRNA  Anesthesia Plan Comments:         Anesthesia Quick Evaluation

## 2016-03-12 NOTE — Progress Notes (Signed)
Pharmacy Antibiotic Note  Adam Liu is a 65 y.o. male admitted on 03/12/2016 with cellulitis.  Patient to undergo I&D with noted high risk of progression to necrotizing process. Pharmacy has been consulted for Vancomycin dosing.  MD also dosing Zosyn  Plan: Vancomycin 2gm IV x 1 followed by 750mg  IV q12h Zosyn per MD F/U cultures, check vancomycin trough level as needed  Height: 6\' 3"  (190.5 cm) Weight: 237 lb (107.5 kg) IBW/kg (Calculated) : 84.5  Temp (24hrs), Avg:98.4 F (36.9 C), Min:98.2 F (36.8 C), Max:98.6 F (37 C)   Recent Labs Lab 03/12/16 1748 03/12/16 1814 03/12/16 1839  WBC 17.4*  --   --   CREATININE  --   --  0.88  LATICACIDVEN  --  2.38*  --     Estimated Creatinine Clearance: 110.9 mL/min (by C-G formula based on SCr of 0.88 mg/dL).    No Known Allergies  Antimicrobials this admission: 10/3 vanc >>   10/3 zosyn >>    Dose adjustments this admission:    Microbiology results: 10/3 UCx:     Thank you for allowing pharmacy to be a part of this patient's care.  Everette Rank, PharmD 03/12/2016 9:10 PM

## 2016-03-12 NOTE — H&P (Signed)
Adam Liu is an 65 y.o. male.    Chief Complaint:  Left Inguinoscrotal Soft Tissue Infection / Abscess  HPI:   Left Inguinoscrotal Soft Tissue Infection / Abscess - 3 day prodrome left scrotal / inhuinal pain with progression of local symptoms now malaise. Imaging c/w progressing soft tissue infection likely starting as scrotal abscess. No subcutanoues gas or rectal tracking. Denies diabetes, HIV, or other known immune compromise. No recurrent soft tissue infections.  PMH sig for hip replacement. No CV disease / blood thinners. His PC is Adam Liu with Adam Liu.   Today "Adam Liu" is seen for urgent evaluation for above. Last meal 7AM today.   Past Medical History:  Diagnosis Date  . Arthritis   . Basal cell cancer   . Cancer ([place])    basal cel carcinoma removed from back    Past Surgical History:  Procedure Laterality Date  . KNEE ARTHROSCOPY     bilateral   . right foot surgery      congenital defect of bone   . TOTAL HIP ARTHROPLASTY  02/11/2012   Procedure: TOTAL HIP ARTHROPLASTY ANTERIOR APPROACH;  Surgeon: Mauri Pole, Liu;  Location: [facility];  Service: Orthopedics;  Laterality: Left;    Family History  Problem Relation Age of Onset  . COPD Father   . Lung cancer Father    Social History:  reports that he has never smoked. He has never used smokeless tobacco. He reports that he drinks alcohol. He reports that he does not use drugs.  Allergies: No Known Allergies   (Not in a hospital admission)  Results for orders placed or performed during the hospital encounter of 03/12/16 (from the past 48 hour(s))  CBC with Differential/Platelet     Status: Abnormal   Collection Time: 03/12/16  5:48 PM  Result Value Ref Range   WBC 17.4 (H) 4.0 - 10.5 K/uL   RBC 5.19 4.22 - 5.81 MIL/uL   Hemoglobin 16.5 13.0 - 17.0 g/dL   HCT 48.1 39.0 - 52.0 %   MCV 92.7 78.0 - 100.0 fL   MCH 31.8 26.0 - 34.0 pg   MCHC 34.3 30.0 - 36.0 g/dL   RDW 13.5 11.5 - 15.5 %   Platelets  198 150 - 400 K/uL   Neutrophils Relative % 82 %   Neutro Abs 14.2 (H) 1.7 - 7.7 K/uL   Lymphocytes Relative 9 %   Lymphs Abs 1.6 0.7 - 4.0 K/uL   Monocytes Relative 9 %   Monocytes Absolute 1.6 (H) 0.1 - 1.0 K/uL   Eosinophils Relative 0 %   Eosinophils Absolute 0.0 0.0 - 0.7 K/uL   Basophils Relative 0 %   Basophils Absolute 0.0 0.0 - 0.1 K/uL  Urinalysis, Routine w reflex microscopic (not at [facility])     Status: Abnormal   Collection Time: 03/12/16  5:57 PM  Result Value Ref Range   Color, Urine ORANGE (A) YELLOW    Comment: BIOCHEMICALS MAY BE AFFECTED BY COLOR   APPearance CLOUDY (A) CLEAR   Specific Gravity, Urine 1.034 (H) 1.005 - 1.030   pH 5.5 5.0 - 8.0   Glucose, UA NEGATIVE NEGATIVE mg/dL   Hgb urine dipstick NEGATIVE NEGATIVE   Bilirubin Urine MODERATE (A) NEGATIVE   Ketones, ur 40 (A) NEGATIVE mg/dL   Protein, ur 30 (A) NEGATIVE mg/dL   Nitrite POSITIVE (A) NEGATIVE   Leukocytes, UA SMALL (A) NEGATIVE  Urine microscopic-add on     Status: Abnormal   Collection Time: 03/12/16  5:57 PM  Result Value Ref Range   Squamous Epithelial / LPF 0-5 (A) NONE SEEN   WBC, UA 6-30 0 - 5 WBC/hpf   RBC / HPF NONE SEEN 0 - 5 RBC/hpf   Bacteria, UA FEW (A) NONE SEEN   Urine-Other MUCOUS PRESENT   I-Stat CG4 Lactic Acid, ED     Status: Abnormal   Collection Time: 03/12/16  6:14 PM  Result Value Ref Range   Lactic Acid, Venous 2.38 (HH) 0.5 - 1.9 mmol/L   Comment NOTIFIED PHYSICIAN   Basic metabolic panel     Status: Abnormal   Collection Time: 03/12/16  6:39 PM  Result Value Ref Range   Sodium 132 (L) 135 - 145 mmol/L   Potassium 3.8 3.5 - 5.1 mmol/L   Chloride 100 (L) 101 - 111 mmol/L   CO2 22 22 - 32 mmol/L   Glucose, Bld 111 (H) 65 - 99 mg/dL   BUN 25 (H) 6 - 20 mg/dL   Creatinine, Ser 0.88 0.61 - 1.24 mg/dL   Calcium 8.6 (L) 8.9 - 10.3 mg/dL   GFR calc non Af Amer >60 >60 mL/min   GFR calc Af Amer >60 >60 mL/min    Comment: (NOTE) The eGFR has been calculated using the  CKD EPI equation. This calculation has not been validated in all clinical situations. eGFR's persistently <60 mL/min signify possible Chronic Kidney Disease.    Anion gap 10 5 - 15   Ct Abdomen Pelvis W Contrast  Result Date: 03/12/2016 CLINICAL DATA:  65 y/o  M; left scrotum pain and swelling. EXAM: CT ABDOMEN AND PELVIS WITH CONTRAST TECHNIQUE: Multidetector CT imaging of the abdomen and pelvis was performed using the standard protocol following bolus administration of intravenous contrast. CONTRAST:  122m ISOVUE-300 IOPAMIDOL (ISOVUE-300) INJECTION 61% COMPARISON:  None. FINDINGS: Lower chest: No acute abnormality. Hepatobiliary: The few scattered low-attenuation lucencies throughout the liver measuring up to 8 mm are too small to characterize but probably represent cysts. Normal gallbladder. No intra or extrahepatic biliary ductal dilatation. Pancreas: Unremarkable. No pancreatic ductal dilatation or surrounding inflammatory changes. Spleen: Scattered punctate calcified granuloma. Adrenals/Urinary Tract: Normal adrenal glands. Left kidney interpolar 5.2 cm fluid attenuating well-circumscribed structure compatible with a cyst. No urinary stone is identified. No hydronephrosis. No obstructive uropathy. Normal bladder. Stomach/Bowel: Stomach is within normal limits. Appendix appears normal. No evidence of bowel wall thickening, distention, or inflammatory changes. Vascular/Lymphatic: Aortic atherosclerosis. No enlarged abdominal or pelvic lymph nodes. Reproductive: Prostate is unremarkable. Centered in the left inguinal fold of subcutaneous fat inferior to the pubic symphysis is a rim enhancing fluid collection measuring 59 x 20 by 26 mm (AP x ML x CC), series 3, image 78 and series 2, image 101. The collection is separate from the scrotum or inguinal canal. There is surrounding fat stranding throughout the subcutaneous fat of the left lower anterior pelvic wall, left greater than right scrotal wall, and  left ischial anal fossa. Other: Small paraumbilical fat containing hernia. No ascites. Small bilateral fat containing inguinal hernias. Musculoskeletal: Partially visualized total left hip prosthesis. IMPRESSION: Rim enhancing collection likely representing an abscess centered in the subcutaneous fat of the left inguinal fold just inferior to pubic symphysis measuring up to 59 mm. Surrounding inflammatory changes of the left-greater-than-right scrotal wall, left ischial anal fossa, and skin of left anterior lower pelvis likely represents cellulitis. Electronically Signed   By: LKristine GarbeM.D.   On: 03/12/2016 19:45    Review of Systems  Constitutional:  Positive for malaise/fatigue.  HENT: Negative.   Eyes: Negative.   Respiratory: Negative.   Cardiovascular: Negative.   Gastrointestinal: Negative.   Genitourinary: Negative.  Negative for frequency.  Musculoskeletal: Negative.   Skin: Negative.   Neurological: Negative.   Endo/Heme/Allergies: Negative.   Psychiatric/Behavioral: Negative.     Blood pressure 156/81, pulse 76, temperature 98.6 F (37 C), temperature source Oral, resp. rate 18, height 6' 3" (1.905 m), weight 107.5 kg (237 lb), SpO2 100 %. Physical Exam  Constitutional: He is oriented to person, place, and time. He appears well-developed.  HENT:  Head: Normocephalic.  Neck: Normal range of motion.  Cardiovascular: Normal rate.   Respiratory: Effort normal.  GI: Soft.  Genitourinary:  Genitourinary Comments: Left inguinal erythema tarcking to fluctunt collection in scrotum. No crepitus. Very foul smelling. No gross rectal infovement.   Musculoskeletal: Normal range of motion.  Neurological: He is alert and oriented to person, place, and time.  Skin: Skin is warm.  Psychiatric: He has a normal mood and affect. Judgment and thought content normal.     Assessment/Plan  Left Inguinoscrotal Soft Tissue Infection / Abscess - Significant infection with high risk  of progression to necrotizing process. Rec operative I+D / exploration, admission for IV ABX and teaching local wound care. CX's to be obtained intra-op.  Begin Vanc + Zosyn.  Risks, benefits, alternatives, expected peri-op course, need for possible tubes / drains as well as staged approach discussed.   Alexis Frock, Liu 03/12/2016, 8:43 PM

## 2016-03-12 NOTE — ED Provider Notes (Signed)
[facility] DEPT Provider Note   CSN: [id number] Arrival date & time: 03/12/16  1250     History   Chief Complaint Chief Complaint  Patient presents with  . Testicle Pain    Left    HPI Adam Liu is a 65 y.o. male.  65 year old male presents with 3 days of left scrotal erythema and swelling with purulent drainage. Had rigors last night but did not take his temperature. No associated vomiting or diarrhea. Denies abdominal discomfort. Denies any dysuria or hematuria. No severe testicular pain. Pain is been persistent and characterized as sharp and nothing makes them better. No treatment used prior to arrival Denies any history of trauma to the area. Patient seen by his Dr. today who was sent here for further evaluation of possible testicular torsion.      Past Medical History:  Diagnosis Date  . Arthritis   . Basal cell cancer   . Cancer ([place])    basal cel carcinoma removed from back    Patient Active Problem List   Diagnosis Date Noted  . Basal cell cancer   . Obese 02/12/2012  . Expected blood loss anemia 02/12/2012  . S/P left THA, AA 02/11/2012    Past Surgical History:  Procedure Laterality Date  . KNEE ARTHROSCOPY     bilateral   . right foot surgery      congenital defect of bone   . TOTAL HIP ARTHROPLASTY  02/11/2012   Procedure: TOTAL HIP ARTHROPLASTY ANTERIOR APPROACH;  Surgeon: Mauri Pole, MD;  Location: [facility];  Service: Orthopedics;  Laterality: Left;       Home Medications    Prior to Admission medications   Medication Sig Start Date End Date Taking? Authorizing Provider  benzoyl peroxide 10 % gel Apply 1 application topically daily as needed (acne).   Yes Historical Provider, MD    Family History Family History  Problem Relation Age of Onset  . COPD Father   . Lung cancer Father     Social History Social History  Substance Use Topics  . Smoking status: Never Smoker  . Smokeless tobacco: Never Used  . Alcohol use Yes   Comment: occasional      Allergies   Review of patient's allergies indicates no known allergies.   Review of Systems Review of Systems  All other systems reviewed and are negative.    Physical Exam Updated Vital Signs BP 125/68 (BP Location: Left Arm)   Pulse 76   Temp 98.2 F (36.8 C) (Oral)   Resp 16   Ht 6\' 3"  (1.905 m)   Wt 107.5 kg   SpO2 99%   BMI 29.62 kg/m   Physical Exam  Constitutional: He is oriented to person, place, and time. He appears well-developed and well-nourished.  Non-toxic appearance. No distress.  HENT:  Head: Normocephalic and atraumatic.  Eyes: Conjunctivae, EOM and lids are normal. Pupils are equal, round, and reactive to light.  Neck: Normal range of motion. Neck supple. No tracheal deviation present. No thyroid mass present.  Cardiovascular: Normal rate, regular rhythm and normal heart sounds.  Exam reveals no gallop.   No murmur heard. Pulmonary/Chest: Effort normal and breath sounds normal. No stridor. No respiratory distress. He has no decreased breath sounds. He has no wheezes. He has no rhonchi. He has no rales.  Abdominal: Soft. Normal appearance and bowel sounds are normal. He exhibits no distension. There is no tenderness. There is no rebound and no CVA tenderness.  Genitourinary:  Musculoskeletal: Normal range of motion. He exhibits no edema or tenderness.  Lymphadenopathy:       Left: Inguinal adenopathy present.  Neurological: He is alert and oriented to person, place, and time. He has normal strength. No cranial nerve deficit or sensory deficit. GCS eye subscore is 4. GCS verbal subscore is 5. GCS motor subscore is 6.  Skin: Skin is warm and dry. No rash noted. There is erythema.  Psychiatric: He has a normal mood and affect. His speech is normal and behavior is normal.  Nursing note and vitals reviewed.    ED Treatments / Results  Labs (all labs ordered are listed, but only abnormal results are displayed) Labs Reviewed    URINE CULTURE  URINALYSIS, ROUTINE W REFLEX MICROSCOPIC (NOT AT [facility])  CBC WITH DIFFERENTIAL/PLATELET  BASIC METABOLIC PANEL  I-STAT CG4 LACTIC ACID, ED    EKG  EKG Interpretation None       Radiology No results found.  Procedures Procedures (including critical care time)  Medications Ordered in ED Medications  0.9 %  sodium chloride infusion (not administered)  piperacillin-tazobactam (ZOSYN) IVPB 3.375 g (not administered)     Initial Impression / Assessment and Plan / ED Course  I have reviewed the triage vital signs and the nursing notes.  Pertinent labs & imaging results that were available during my care of the patient were reviewed by me and considered in my medical decision making (see chart for details).  Clinical Course    Patient started on IV antibiotics and CT results reviewed with the urologist on call, take the patient to the operating theater  Final Clinical Impressions(s) / ED Diagnoses   Final diagnoses:  None    New Prescriptions New Prescriptions   No medications on file     Lacretia Leigh, MD 03/12/16 2022

## 2016-03-12 NOTE — Anesthesia Procedure Notes (Signed)
Procedure Name: Intubation Date/Time: 03/12/2016 9:43 PM Performed by: Noralyn Pick D Pre-anesthesia Checklist: Patient identified, Emergency Drugs available, Suction available and Patient being monitored Patient Re-evaluated:Patient Re-evaluated prior to inductionOxygen Delivery Method: Circle system utilized Preoxygenation: Pre-oxygenation with 100% oxygen Intubation Type: IV induction, Rapid sequence and Cricoid Pressure applied Laryngoscope Size: Mac and 4 Grade View: Grade I Tube type: Oral Tube size: 7.5 mm Number of attempts: 1 Airway Equipment and Method: Stylet Placement Confirmation: ETT inserted through vocal cords under direct vision,  positive ETCO2 and breath sounds checked- equal and bilateral Secured at: 22 cm Tube secured with: Tape Dental Injury: Teeth and Oropharynx as per pre-operative assessment

## 2016-03-13 ENCOUNTER — Encounter (HOSPITAL_COMMUNITY): Payer: Self-pay | Admitting: Urology

## 2016-03-13 LAB — CBC
HEMATOCRIT: 39.6 % (ref 39.0–52.0)
Hemoglobin: 13.1 g/dL (ref 13.0–17.0)
MCH: 30.6 pg (ref 26.0–34.0)
MCHC: 33.1 g/dL (ref 30.0–36.0)
MCV: 92.5 fL (ref 78.0–100.0)
PLATELETS: 193 10*3/uL (ref 150–400)
RBC: 4.28 MIL/uL (ref 4.22–5.81)
RDW: 13.3 % (ref 11.5–15.5)
WBC: 11.5 10*3/uL — AB (ref 4.0–10.5)

## 2016-03-13 LAB — BASIC METABOLIC PANEL
ANION GAP: 6 (ref 5–15)
BUN: 20 mg/dL (ref 6–20)
CALCIUM: 8.4 mg/dL — AB (ref 8.9–10.3)
CO2: 25 mmol/L (ref 22–32)
Chloride: 105 mmol/L (ref 101–111)
Creatinine, Ser: 0.84 mg/dL (ref 0.61–1.24)
Glucose, Bld: 103 mg/dL — ABNORMAL HIGH (ref 65–99)
POTASSIUM: 4 mmol/L (ref 3.5–5.1)
Sodium: 136 mmol/L (ref 135–145)

## 2016-03-13 NOTE — Anesthesia Postprocedure Evaluation (Signed)
Anesthesia Post Note  Patient: Adam Liu  Procedure(s) Performed: Procedure(s) (LRB): INCISION AND DRAINAGE ABSCESS (N/A)  Patient location during evaluation: PACU Anesthesia Type: General Level of consciousness: awake and alert Pain management: pain level controlled Vital Signs Assessment: post-procedure vital signs reviewed and stable Respiratory status: spontaneous breathing, nonlabored ventilation, respiratory function stable and patient connected to nasal cannula oxygen Cardiovascular status: blood pressure returned to baseline and stable Postop Assessment: no signs of nausea or vomiting Anesthetic complications: no    Last Vitals:  Vitals:   03/12/16 2327 03/13/16 0445  BP: (!) 153/70 (!) 144/69  Pulse: 77 66  Resp: 18 18  Temp: 36.8 C 36.7 C    Last Pain:  Vitals:   03/13/16 0445  TempSrc: Oral  PainSc:                  Matika Bartell J

## 2016-03-13 NOTE — Op Note (Signed)
Adam Liu, Adam Liu NO.:  [id number]  MEDICAL RECORD NO.:  [id number]  LOCATION:  [id number]                         FACILITY:  [facility]  PHYSICIAN:  Alexis Frock, MD     DATE OF BIRTH:  Aug 26, 1950  DATE OF PROCEDURE:  03/12/2016                               OPERATIVE REPORT   PREOPERATIVE DIAGNOSIS:  Large left inguinal scrotal abscess.  POSTOPERATIVE DIAGNOSIS:  Large left inguinal scrotal abscess with early necrotizing fasciitis.   PROCEDURE:  Incision and drainage of inguinal scrotal abscess.  ESTIMATED BLOOD LOSS:  Nil.  COMPLICATIONS:  None.  SPECIMENS: 1. Left abscess fluid for Gram stain and culture. 2. Tissue for tissue culture.  FINDINGS: 1. Early necrotizing infection of the inguinal scrotal region in the     left. 2. Non-involvement of the anterior scrotal compartment.  INDICATION:  Adam Liu is a 65 year old gentleman with unremarkable past medical history who adamantly denies any compromise or diabetes who presents with 3-day prodrome of left inguinal scrotal inflammation.  He was found to have lactic acidosis and leukocytosis and exam worrisome for early necrotizing infection of his inguinal scrotal area.  CT imaging revealed no obvious tracking to his rectum or intra-abdominal cavity.  Exam was highly concerning for impending Fournier's gangrene. I felt that urgent incision, drainage, and debridement was warranted. Informed consent was obtained and placed in the medical record.  PROCEDURE IN DETAIL:  The patient being Adam Liu verified. Procedure being incision, drainage, and debridement of left inguinal scrotal abscess confirmed.  Procedure was carried out.  Time-out was performed.  Intravenous antibiotics were administered.  General anesthesia induced.  The patient was placed into a supine position. Sterile field was created by first clipper shaving and then prepping and draping the patient's entire inguinal scrotal area on the  left from behind the scrotum tracking towards the left inguinal region groin area, palpable erythema and gross purulence, incredibly foul-smelling drainage.  Next, iodine was used to prep the same area.  An incision was made from the area of maximal fluctuance up toward the inguinal area along the area of beefy red erythema.  Copious and incredibly foul- smelling fluid was evacuated, a sample of this was set aside for Gram stain and culture.  The rest of it was suctioned away.  Using surgeon's finger, this did track into the soft tissues towards the area of the left inguinal region.  There was no obvious crepitus.  The incision was carried on approximately 10 cm in total length towards the inguinal area and the edges were exposed.  There were some frankly necrotic and nonviable tissue at the inferior aspect of the wound and this was debrided.  Representative specimen set aside for tissue culture.  No more formal debridement such as Pulsavac was needed.  Again, using surgeon's finger manipulation, there was no obvious tracking to the intrascrotal component to the abdominal wall frankly or towards the rectal area.  Next, the area was packed with saline soaked gauze and loosely reapproximated using #1 Prolene x4 purposely leaving significant space in between to allow for future wound packing, which he will likely need for several weeks.  The incision site was infiltrated with 50 mL  of 0.5% Marcaine with epinephrine.  Dressing of ABD and mesh panties were applied.  Procedure was terminated.  The patient tolerated the procedure well.  No immediate periprocedural complications.  The patient was taken to the postanesthesia care unit in a stable condition.          ______________________________ Alexis Frock, MD     TM/MEDQ  D:  03/12/2016  T:  03/13/2016  Job:  [id number]

## 2016-03-13 NOTE — Progress Notes (Signed)
1 Day Post-Op  Subjective:  Left Inguinoscrotal Soft Tissue Infection / Abscess - s/p operative inicsion / drainage / debridement liekly early necrotizing infection. Abscess and tissue CX's 10/3 pending on empiric Vanc + Zosyn.   Today "Adam Liu" is stable. No fevers over night.  Objective: Vital signs in last 24 hours: Temp:  [98 F (36.7 C)-98.8 F (37.1 C)] 98 F (36.7 C) (10/04 0445) Pulse Rate:  [66-78] 66 (10/04 0445) Resp:  [12-18] 18 (10/04 0445) BP: (125-160)/(59-85) 144/69 (10/04 0445) SpO2:  [96 %-100 %] 97 % (10/04 0445) Weight:  [107 kg (236 lb)-107.5 kg (237 lb)] 107 kg (236 lb) (10/03 2327) Last BM Date: 03/12/16  Intake/Output from previous day: 10/03 0701 - 10/04 0700 In: 700 [I.V.:500; IV Piggyback:200] Out: 510 [Urine:500; Blood:10] Intake/Output this shift: Total I/O In: 500 [I.V.:500] Out: 510 [Urine:500; Blood:10]  General appearance: alert, cooperative and appears stated age Eyes: negative Nose: Nares normal. Septum midline. Mucosa normal. No drainage or sinus tenderness. Throat: lips, mucosa, and tongue normal; teeth and gums normal Neck: supple, symmetrical, trachea midline Back: symmetric, no curvature. ROM normal. No CVA tenderness. Resp: non-labored Cardio: Nl rate GI: soft, non-tender; bowel sounds normal; no masses,  no organomegaly Male genitalia: left inguinal-scrotal wound c/d/i with wet to dry gauze and retention sutures in place. Non-foul. Stable nearby skin erytehma w/o crepitus.  Extremities: extremities normal, atraumatic, no cyanosis or edema Skin: Skin color, texture, turgor normal. No rashes or lesions Neurologic: Grossly normal  Lab Results:   Recent Labs  03/12/16 1748 03/13/16 0555  WBC 17.4* 11.5*  HGB 16.5 13.1  HCT 48.1 39.6  PLT 198 193   BMET  Recent Labs  03/12/16 1839  NA 132*  K 3.8  CL 100*  CO2 22  GLUCOSE 111*  BUN 25*  CREATININE 0.88  CALCIUM 8.6*   PT/INR No results for input(s): LABPROT, INR  in the last 72 hours. ABG No results for input(s): PHART, HCO3 in the last 72 hours.  Invalid input(s): PCO2, PO2  Studies/Results: Ct Abdomen Pelvis W Contrast  Result Date: 03/12/2016 CLINICAL DATA:  65 y/o  M; left scrotum pain and swelling. EXAM: CT ABDOMEN AND PELVIS WITH CONTRAST TECHNIQUE: Multidetector CT imaging of the abdomen and pelvis was performed using the standard protocol following bolus administration of intravenous contrast. CONTRAST:  1101mL ISOVUE-300 IOPAMIDOL (ISOVUE-300) INJECTION 61% COMPARISON:  None. FINDINGS: Lower chest: No acute abnormality. Hepatobiliary: The few scattered low-attenuation lucencies throughout the liver measuring up to 8 mm are too small to characterize but probably represent cysts. Normal gallbladder. No intra or extrahepatic biliary ductal dilatation. Pancreas: Unremarkable. No pancreatic ductal dilatation or surrounding inflammatory changes. Spleen: Scattered punctate calcified granuloma. Adrenals/Urinary Tract: Normal adrenal glands. Left kidney interpolar 5.2 cm fluid attenuating well-circumscribed structure compatible with a cyst. No urinary stone is identified. No hydronephrosis. No obstructive uropathy. Normal bladder. Stomach/Bowel: Stomach is within normal limits. Appendix appears normal. No evidence of bowel wall thickening, distention, or inflammatory changes. Vascular/Lymphatic: Aortic atherosclerosis. No enlarged abdominal or pelvic lymph nodes. Reproductive: Prostate is unremarkable. Centered in the left inguinal fold of subcutaneous fat inferior to the pubic symphysis is a rim enhancing fluid collection measuring 59 x 20 by 26 mm (AP x ML x CC), series 3, image 78 and series 2, image 101. The collection is separate from the scrotum or inguinal canal. There is surrounding fat stranding throughout the subcutaneous fat of the left lower anterior pelvic wall, left greater than right scrotal wall, and left  ischial anal fossa. Other: Small paraumbilical  fat containing hernia. No ascites. Small bilateral fat containing inguinal hernias. Musculoskeletal: Partially visualized total left hip prosthesis. IMPRESSION: Rim enhancing collection likely representing an abscess centered in the subcutaneous fat of the left inguinal fold just inferior to pubic symphysis measuring up to 59 mm. Surrounding inflammatory changes of the left-greater-than-right scrotal wall, left ischial anal fossa, and skin of left anterior lower pelvis likely represents cellulitis. Electronically Signed   By: Kristine Garbe M.D.   On: 03/12/2016 19:45    Anti-infectives: Anti-infectives    Start     Dose/Rate Route Frequency Ordered Stop   03/13/16 1000  vancomycin (VANCOCIN) IVPB 750 mg/150 ml premix     750 mg 150 mL/hr over 60 Minutes Intravenous Every 12 hours 03/12/16 2106     03/12/16 2359  piperacillin-tazobactam (ZOSYN) IVPB 3.375 g     3.375 g 12.5 mL/hr over 240 Minutes Intravenous Every 8 hours 03/12/16 2053     03/12/16 2115  vancomycin (VANCOCIN) 2,000 mg in sodium chloride 0.9 % 500 mL IVPB     2,000 mg 250 mL/hr over 120 Minutes Intravenous STAT 03/12/16 2106 03/12/16 2151   03/12/16 1730  piperacillin-tazobactam (ZOSYN) IVPB 3.375 g     3.375 g 100 mL/hr over 30 Minutes Intravenous  Once 03/12/16 1725 03/12/16 1843      Assessment/Plan:  Left Inguinoscrotal Soft Tissue Infection / Abscess - improving clinically. Dressing change late PM today v. Tomorrow. Remain in house on current ABX pending at least preliminary CX data.  [facility], Adam Liu 03/13/2016

## 2016-03-14 LAB — CBC
HEMATOCRIT: 44.7 % (ref 39.0–52.0)
HEMOGLOBIN: 14.5 g/dL (ref 13.0–17.0)
MCH: 30.7 pg (ref 26.0–34.0)
MCHC: 32.4 g/dL (ref 30.0–36.0)
MCV: 94.7 fL (ref 78.0–100.0)
Platelets: 211 10*3/uL (ref 150–400)
RBC: 4.72 MIL/uL (ref 4.22–5.81)
RDW: 13.3 % (ref 11.5–15.5)
WBC: 9.6 10*3/uL (ref 4.0–10.5)

## 2016-03-14 LAB — BASIC METABOLIC PANEL
Anion gap: 4 — ABNORMAL LOW (ref 5–15)
BUN: 16 mg/dL (ref 6–20)
CHLORIDE: 108 mmol/L (ref 101–111)
CO2: 26 mmol/L (ref 22–32)
Calcium: 8.4 mg/dL — ABNORMAL LOW (ref 8.9–10.3)
Creatinine, Ser: 0.72 mg/dL (ref 0.61–1.24)
GFR calc Af Amer: >60 mL/min (ref >60)
GFR calc non Af Amer: >60 mL/min (ref >60)
GLUCOSE: 109 mg/dL — AB (ref 65–99)
POTASSIUM: 4 mmol/L (ref 3.5–5.1)
Sodium: 138 mmol/L (ref 135–145)

## 2016-03-14 LAB — URINE CULTURE

## 2016-03-14 NOTE — Consult Note (Signed)
[place] Nurse wound consult note Reason for Consult: Post surgical procedure wound requiring dressing and arrangement for [facility] services Wound type:Infectious, surgical Pressure Ulcer POA: No Measurement: 14cm incision with depth not able to be appreciated today. Gauze wicks in place between sutures, bedside RN was present for dressing change with Dr. Tresa Moore yesterday and will perform dressing change today with student RN Wound bed: red, mosit Drainage (amount, consistency, odor) serosanguinous Periwound:erythematous with some induration, patient reports it is much improved Dressing procedure/placement/frequency: Placement of gauze wicks to be inserted today after cleansing with NS.  Patient to be be premedicated prior to dressing change. Case management has already been in and arrangements are being made for home care services. Patient will follow up with Dr. Tresa Moore as directed. [place] nursing team will not follow, but will remain available to this patient, the nursing and medical teams.  Please re-consult if needed. Thanks, Maudie Flakes, MSN, RN, [place], Arther Abbott  Pager# [phone]

## 2016-03-14 NOTE — Care Management Note (Signed)
Case Management Note  Patient Details  Name: Cajun Bayers MRN: [id number] Date of Birth: 01-24-1951  Subjective/Objective:64 y/o m admitted w/Scrotal wall abscess. Visiting here in [place]-staying @ [place], off [address] @ d/c. Referral for [facility] nursing-w/d dsg qd. Provided patient w/HHC agency list for [facility] chosen-rep [place] orders.                    Action/Plan:d/c home w/HHC.   Expected Discharge Date:                  Expected Discharge Plan:  [place]  In-House Referral:     Discharge planning Services  CM Consult  Post Acute Care Choice:    Choice offered to:  Patient  DME Arranged:    DME Agency:     HH Arranged:    [place] Agency:  [place]  Status of Service:  In process, will continue to follow  If discussed at Long Length of Stay Meetings, dates discussed:    Additional Comments:  Dessa Phi, RN 03/14/2016, 1:39 PM

## 2016-03-14 NOTE — Care Management Note (Signed)
Case Management Note  Patient Details  Name: Adam Liu MRN: [id number] Date of Birth: [birth year]  Subjective/Objective:  Patient lives here but staying in [place] [address](he is planning to move to [facility] w/spouse [facility] rep aware. Await HHRN-w-d dsg qd order.                 Action/Plan:d/c home w/HHC   Expected Discharge Date:                  Expected Discharge Plan:  [place]  In-House Referral:     Discharge planning Services  CM Consult  Post Acute Care Choice:    Choice offered to:  Patient  DME Arranged:    DME Agency:     HH Arranged:    [place] Agency:  [place]  Status of Service:  In process, will continue to follow  If discussed at Long Length of Stay Meetings, dates discussed:    Additional Comments:  Dessa Phi, RN 03/14/2016, 2:04 PM

## 2016-03-14 NOTE — Progress Notes (Signed)
2 Days Post-Op  Subjective:  Left Inguinoscrotal Soft Tissue Infection / Abscess - s/p operative inicsion / drainage / debridement liekly early necrotizing infection. Abscess and tissue CX's 10/3 with GPC + GNR and pending on empiric Vanc + Zosyn.   He did not do very well with first dressing change on 10/4 as he cannot see the full extent of his wound and will need home health for this.   Today "Adam Liu" is stable.   Objective: Vital signs in last 24 hours: Temp:  [97.6 F (36.4 C)-98.5 F (36.9 C)] 97.6 F (36.4 C) (10/05 0440) Pulse Rate:  [50-72] 50 (10/05 0440) Resp:  [18-20] 20 (10/05 0440) BP: (117-140)/(54-71) 117/54 (10/05 0440) SpO2:  [98 %-99 %] 99 % (10/05 0440) Last BM Date: 03/13/16  Intake/Output from previous day: 10/04 0701 - 10/05 0700 In: 2862.1 [P.O.:460; I.V.:2002.1; IV Piggyback:400] Out: 1700 [Urine:1700] Intake/Output this shift: Total I/O In: 360 [P.O.:360] Out: 600 [Urine:600]  General appearance: alert, cooperative and appears stated age Head: Normocephalic, without obvious abnormality, atraumatic Eyes: negative Nose: Nares normal. Septum midline. Mucosa normal. No drainage or sinus tenderness. Throat: lips, mucosa, and tongue normal; teeth and gums normal Neck: supple, symmetrical, trachea midline Back: symmetric, no curvature. ROM normal. No CVA tenderness. Resp: non-labored Cardio: Nl rate GI: soft, non-tender; bowel sounds normal; no masses,  no organomegaly Male genitalia: normal, mild scrotal edema. Decreased area of erytehma around left inguinal wound. Packig in place and edges appear clean / viable.  Extremities: extremities normal, atraumatic, no cyanosis or edema Pulses: 2+ and symmetric Lymph nodes: Cervical, supraclavicular, and axillary nodes normal. Neurologic: Grossly normal  Lab Results:   Recent Labs  03/13/16 0555 03/14/16 0520  WBC 11.5* 9.6  HGB 13.1 14.5  HCT 39.6 44.7  PLT 193 211   BMET  Recent Labs   03/13/16 0555 03/14/16 0520  NA 136 138  K 4.0 4.0  CL 105 108  CO2 25 26  GLUCOSE 103* 109*  BUN 20 16  CREATININE 0.84 0.72  CALCIUM 8.4* 8.4*   PT/INR No results for input(s): LABPROT, INR in the last 72 hours. ABG No results for input(s): PHART, HCO3 in the last 72 hours.  Invalid input(s): PCO2, PO2  Studies/Results: Ct Abdomen Pelvis W Contrast  Result Date: 03/12/2016 CLINICAL DATA:  65 y/o  M; left scrotum pain and swelling. EXAM: CT ABDOMEN AND PELVIS WITH CONTRAST TECHNIQUE: Multidetector CT imaging of the abdomen and pelvis was performed using the standard protocol following bolus administration of intravenous contrast. CONTRAST:  166mL ISOVUE-300 IOPAMIDOL (ISOVUE-300) INJECTION 61% COMPARISON:  None. FINDINGS: Lower chest: No acute abnormality. Hepatobiliary: The few scattered low-attenuation lucencies throughout the liver measuring up to 8 mm are too small to characterize but probably represent cysts. Normal gallbladder. No intra or extrahepatic biliary ductal dilatation. Pancreas: Unremarkable. No pancreatic ductal dilatation or surrounding inflammatory changes. Spleen: Scattered punctate calcified granuloma. Adrenals/Urinary Tract: Normal adrenal glands. Left kidney interpolar 5.2 cm fluid attenuating well-circumscribed structure compatible with a cyst. No urinary stone is identified. No hydronephrosis. No obstructive uropathy. Normal bladder. Stomach/Bowel: Stomach is within normal limits. Appendix appears normal. No evidence of bowel wall thickening, distention, or inflammatory changes. Vascular/Lymphatic: Aortic atherosclerosis. No enlarged abdominal or pelvic lymph nodes. Reproductive: Prostate is unremarkable. Centered in the left inguinal fold of subcutaneous fat inferior to the pubic symphysis is a rim enhancing fluid collection measuring 59 x 20 by 26 mm (AP x ML x CC), series 3, image 78 and series 2, image  101. The collection is separate from the scrotum or inguinal  canal. There is surrounding fat stranding throughout the subcutaneous fat of the left lower anterior pelvic wall, left greater than right scrotal wall, and left ischial anal fossa. Other: Small paraumbilical fat containing hernia. No ascites. Small bilateral fat containing inguinal hernias. Musculoskeletal: Partially visualized total left hip prosthesis. IMPRESSION: Rim enhancing collection likely representing an abscess centered in the subcutaneous fat of the left inguinal fold just inferior to pubic symphysis measuring up to 59 mm. Surrounding inflammatory changes of the left-greater-than-right scrotal wall, left ischial anal fossa, and skin of left anterior lower pelvis likely represents cellulitis. Electronically Signed   By: Kristine Garbe M.D.   On: 03/12/2016 19:45    Anti-infectives: Anti-infectives    Start     Dose/Rate Route Frequency Ordered Stop   03/13/16 1000  vancomycin (VANCOCIN) IVPB 750 mg/150 ml premix     750 mg 150 mL/hr over 60 Minutes Intravenous Every 12 hours 03/12/16 2106     03/12/16 2359  piperacillin-tazobactam (ZOSYN) IVPB 3.375 g     3.375 g 12.5 mL/hr over 240 Minutes Intravenous Every 8 hours 03/12/16 2053     03/12/16 2115  vancomycin (VANCOCIN) 2,000 mg in sodium chloride 0.9 % 500 mL IVPB     2,000 mg 250 mL/hr over 120 Minutes Intravenous STAT 03/12/16 2106 03/12/16 2151   03/12/16 1730  piperacillin-tazobactam (ZOSYN) IVPB 3.375 g     3.375 g 100 mL/hr over 30 Minutes Intravenous  Once 03/12/16 1725 03/12/16 1843      Assessment/Plan:  Left Inguinoscrotal Soft Tissue Infection / Abscess - improving clinically.   Remain in house on current ABX pending further CX data.  Wound consult and Case Management consult to help with daily wet to dry dressing changes and arrange home health to continue at discharge.    [facility], Hanna Aultman 03/14/2016

## 2016-03-15 ENCOUNTER — Encounter (HOSPITAL_COMMUNITY): Payer: Self-pay | Admitting: Urology

## 2016-03-15 MED ORDER — SULFAMETHOXAZOLE-TRIMETHOPRIM 800-160 MG PO TABS: 1.0000 | ORAL_TABLET | Freq: Two times a day (BID) | ORAL | 0 refills | Status: AC

## 2016-03-15 MED ORDER — OXYCODONE-ACETAMINOPHEN 5-325 MG PO TABS: 1.0000 | ORAL_TABLET | Freq: Three times a day (TID) | ORAL | 0 refills | Status: AC | PRN

## 2016-03-15 MED ORDER — SENNOSIDES-DOCUSATE SODIUM 8.6-50 MG PO TABS: 1.0000 | ORAL_TABLET | Freq: Two times a day (BID) | ORAL | 0 refills | Status: AC

## 2016-03-15 NOTE — Discharge Summary (Signed)
Physician Discharge Summary  Patient ID: Adam Liu MRN: [id number] DOB/AGE: [birth year] 65 y.o.  Admit date: 03/12/2016 Discharge date: 03/15/2016  Admission Diagnoses: Inguinal necrotizing fascitis  Discharge Diagnoses:  Inguinal necrotizing fascitis  Discharged Condition: fair  Hospital Course:   Left Inguinal necrotizing fascitis- s/p operative inicsion / drainage / debridement liekly early necrotizing infection on 10/3 the day of admission. Abscess and tissue CX's 10/3 with GPC + GNRon empiric Vanc + Zosyn. Final CX "normal skin flora" and MRSA nasal swab negative suggesting polymicrobial etiology. His wound was managed with wet to dry dressings which will continue daily via home health. By the afternoon of 10/6, he is afebrile, ambulatory, tollerating PO meds and hydration, and felt to be adequate for discharge.     Consults: wound ostomy, case management.   Significant Diagnostic Studies: labs: as per above  Treatments: surgery: incicion / drainage / debridement left inguinal soft tissue infection 03/12/16  Discharge Exam: Blood pressure 113/64, pulse (!) 50, temperature 98.4 F (36.9 C), temperature source Oral, resp. rate 20, height 6\' 3"  (1.905 m), weight 107 kg (236 lb), SpO2 98 %. General appearance: alert, cooperative and appears stated age Eyes: negative Nose: Nares normal. Septum midline. Mucosa normal. No drainage or sinus tenderness. Throat: lips, mucosa, and tongue normal; teeth and gums normal Neck: supple, symmetrical, trachea midline Back: symmetric, no curvature. ROM normal. No CVA tenderness. Resp: non-labored on room air.  Cardio: Nl rate GI: soft, non-tender; bowel sounds normal; no masses,  no organomegaly Male genitalia: mild scrotal edema (improved). Improvign left inguinal soft tissue infection now with minimal surrounding erythema and induration. Wet to dry dressing in plzed x 5 between 4 retention sutures. No prululence or necrotic tissue.   Extremities: extremities normal, atraumatic, no cyanosis or edema Pulses: 2+ and symmetric Lymph nodes: Cervical, supraclavicular, and axillary nodes normal. Neurologic: Grossly normal  Disposition: 06-Home-Health Care Svc     Medication List    TAKE these medications   benzoyl peroxide 10 % gel Apply 1 application topically daily as needed (acne).   oxyCODONE-acetaminophen 5-325 MG tablet Commonly known as:  ROXICET Take 1-2 tablets by mouth every 8 (eight) hours as needed for severe pain. Or prior to dressing changes.   senna-docusate 8.6-50 MG tablet Commonly known as:  Senokot-S Take 1 tablet by mouth 2 (two) times daily. While taking strong pain meds to prevent constipation   sulfamethoxazole-trimethoprim 800-160 MG tablet Commonly known as:  BACTRIM DS,SEPTRA DS Take 1 tablet by mouth 2 (two) times daily. For severe soft tissue infection        Signed: Michaiah Maiden 03/15/2016, 12:22 PM

## 2016-03-15 NOTE — Care Management Note (Signed)
Case Management Note  Patient Details  Name: Adam Liu MRN: [id number] Date of Birth: August 22, 1950  Subjective/Objective:  [facility] rep-Karen aware to talk to patient prior d/c about patient's concerns. HHRN-[facility]. No further CM needs.                  Action/Plan:d/c home w/HHC.   Expected Discharge Date:                  Expected Discharge Plan:  [place]  In-House Referral:     Discharge planning Services  CM Consult  Post Acute Care Choice:    Choice offered to:  Patient  DME Arranged:    DME Agency:     HH Arranged:  RN [place] Agency:  [place]  Status of Service:  Completed, signed off  If discussed at [place] of Stay Meetings, dates discussed:    Additional Comments:  Dessa Phi, RN 03/15/2016, 1:10 PM

## 2016-03-15 NOTE — Discharge Instructions (Signed)
1 - Continue daily wet to dry dressing changes. All incisions / wounds may get wet. No restrictions on activity.  2 - Call MD or go to ER for fever >102, severe pain / nausea / vomiting not relieved by medications, or acute change in medical status

## 2016-03-16 LAB — AEROBIC/ANAEROBIC CULTURE W GRAM STAIN (SURGICAL/DEEP WOUND)

## 2016-03-16 LAB — AEROBIC/ANAEROBIC CULTURE (SURGICAL/DEEP WOUND)

## 2016-03-19 LAB — FUNGUS STAIN

## 2016-03-19 LAB — FUNGAL STAIN REFLEX

## 2016-03-21 LAB — AEROBIC/ANAEROBIC CULTURE (SURGICAL/DEEP WOUND)

## 2016-03-21 LAB — AEROBIC/ANAEROBIC CULTURE W GRAM STAIN (SURGICAL/DEEP WOUND)

## 2016-04-16 LAB — FUNGUS CULTURE WITH STAIN

## 2016-04-16 LAB — FUNGUS CULTURE RESULT

## 2016-04-16 LAB — FUNGAL ORGANISM REFLEX

## 2017-03-11 IMAGING — CT CT ABD-PELV W/ CM
2 of 5 series · 15 of 46 positions shown, 17 images · IV contrast (ISOVUE)
Comparison: None.

CLINICAL DATA: 64 y/o  M; left scrotum pain and swelling.

EXAM:
CT ABDOMEN AND PELVIS WITH CONTRAST
TECHNIQUE: Multidetector CT imaging of the abdomen and pelvis was performed
using the standard protocol following bolus administration of
intravenous contrast.
CONTRAST:  100mL [id number] IOPAMIDOL (JPB0Z0-333) INJECTION 61%

[Series 2: abd/pel with · axial · 0.96mm/px · z∈[+542,+1077]mm · 12 of 123 slices shown, 14 images]
[im 8/123  soft-tissue]
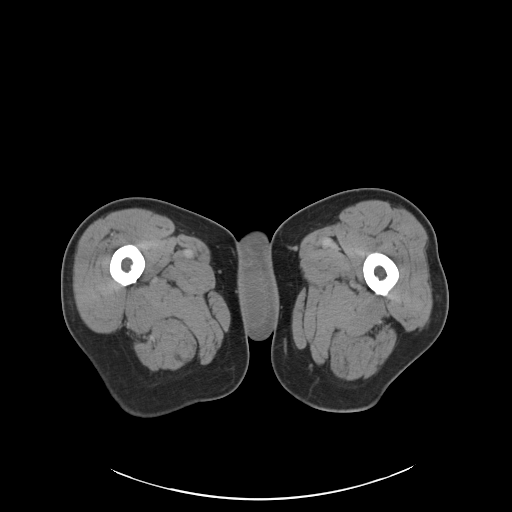
[im 8/123  bone]
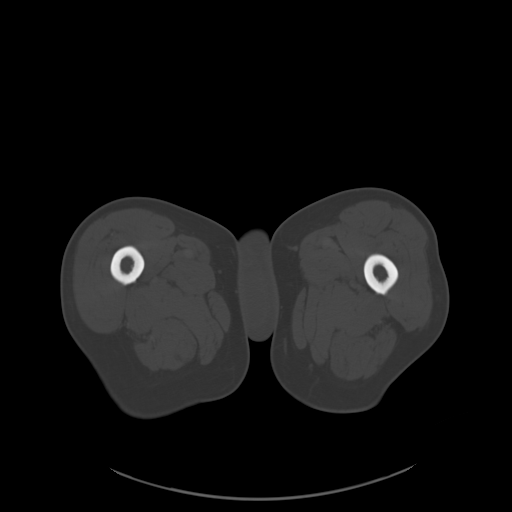
[im 16/123  soft-tissue]
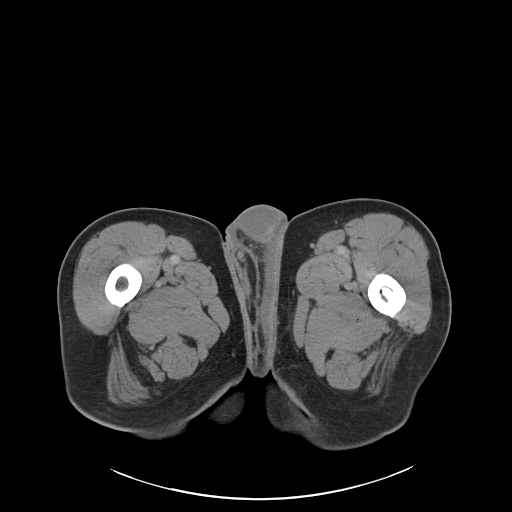
[im 31/123  soft-tissue]
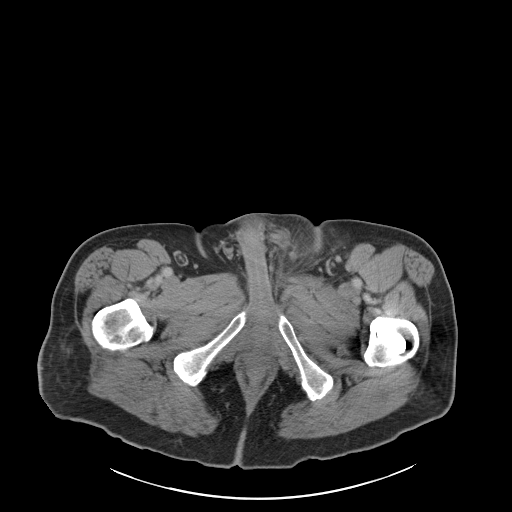
[im 39/123  soft-tissue]
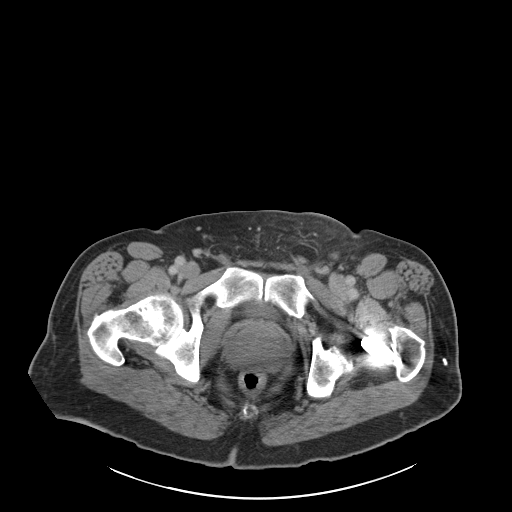
[im 46/123  soft-tissue]
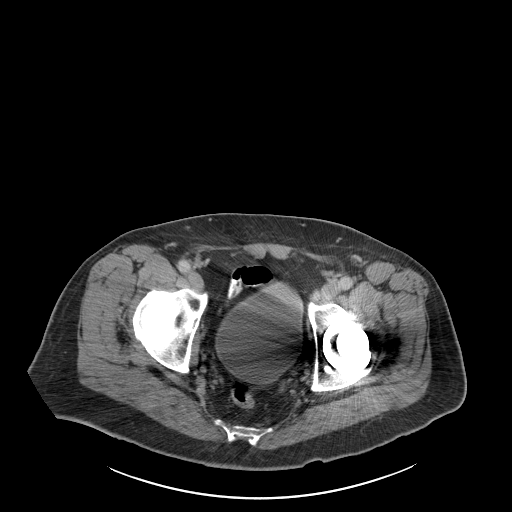
[im 54/123  soft-tissue]
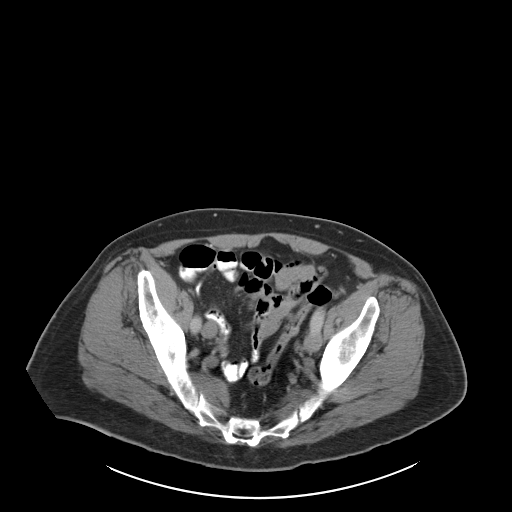
[im 69/123  soft-tissue]
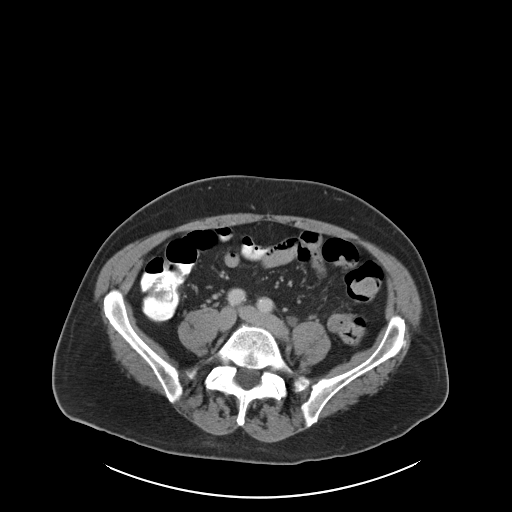
[im 77/123  soft-tissue]
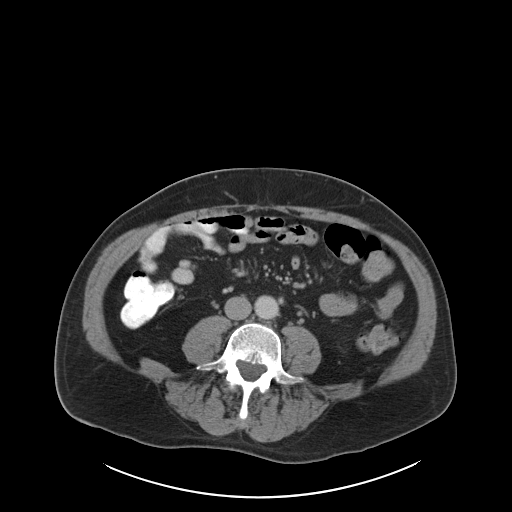
[im 84/123  soft-tissue]
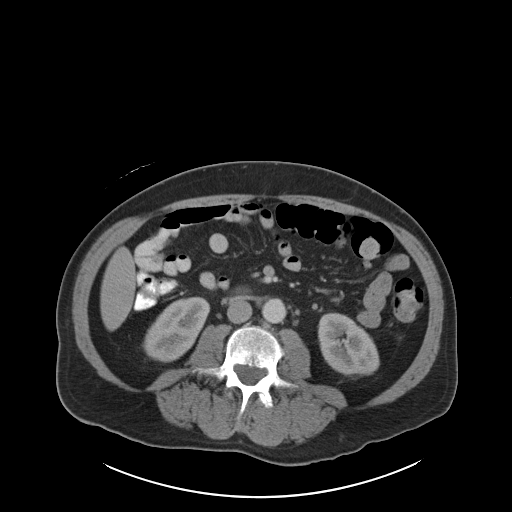
[im 84/123  bone]
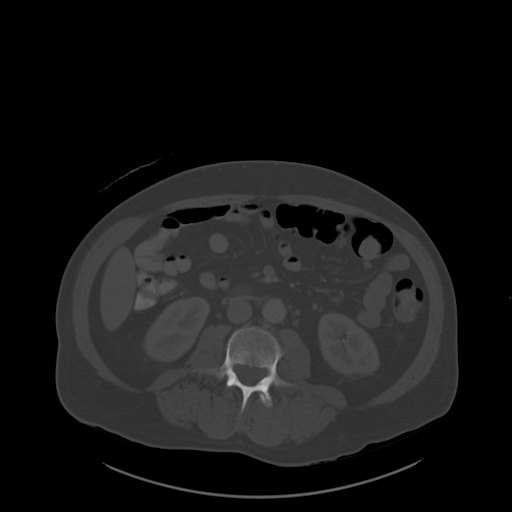
[im 92/123  soft-tissue]
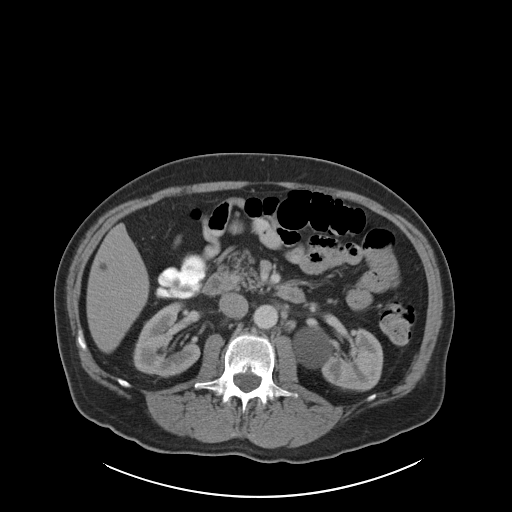
[im 107/123  soft-tissue]
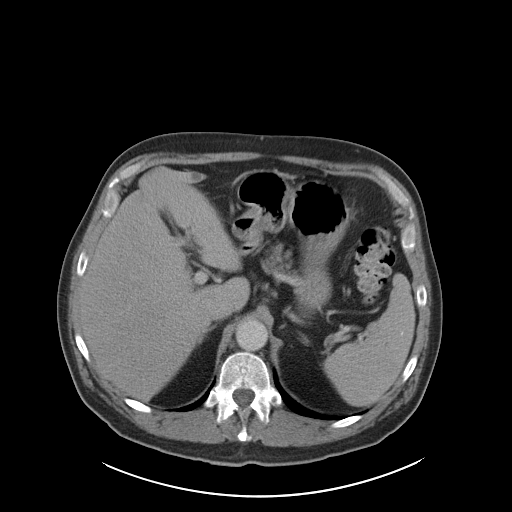
[im 115/123  soft-tissue]
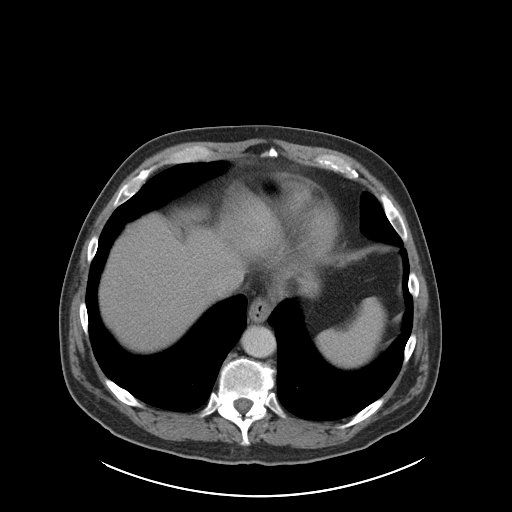

[Series 3: coronal a/|p · coronal · 0.85mm/px · 3 of 171 slices shown]
[im 57/171  soft-tissue]
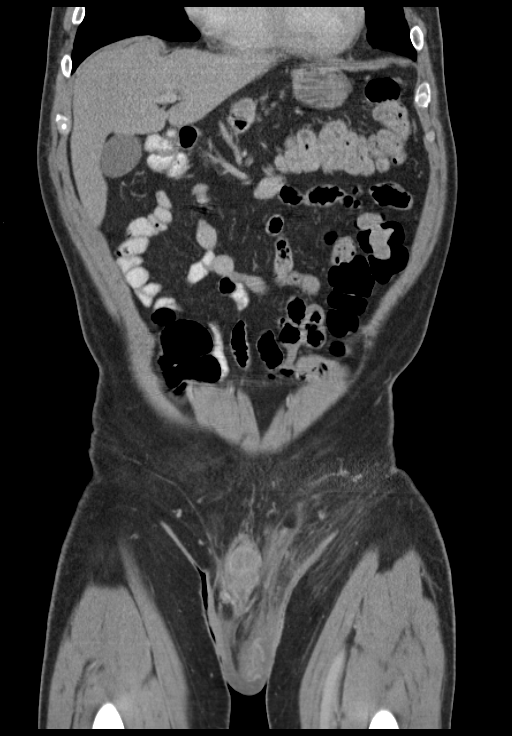
[im 76/171  soft-tissue]
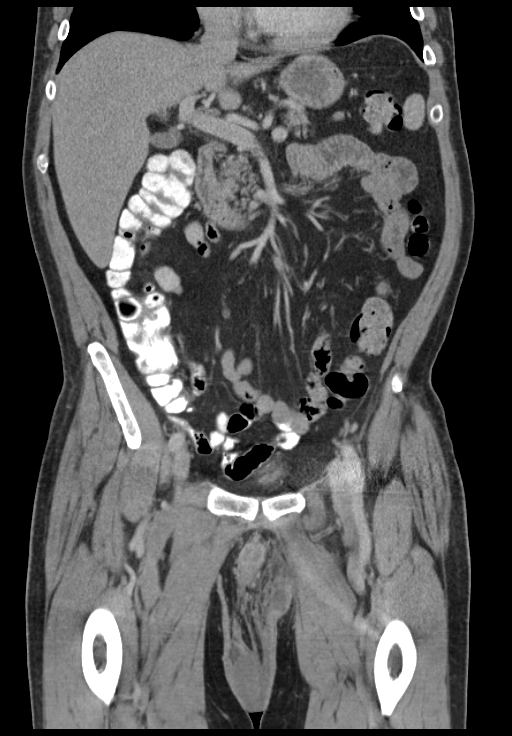
[im 95/171  soft-tissue]
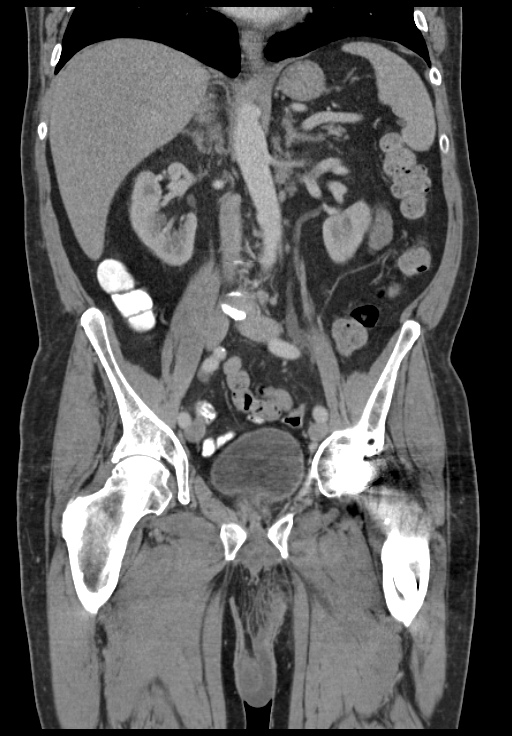

[15 of 46 positions shown; findings below may reference images not displayed]

FINDINGS: Lower chest: No acute abnormality.

Hepatobiliary: The few scattered low-attenuation lucencies
throughout the liver measuring up to 8 mm are too small to
characterize but probably represent cysts. Normal gallbladder. No
intra or extrahepatic biliary ductal dilatation.

Pancreas: Unremarkable. No pancreatic ductal dilatation or
surrounding inflammatory changes.

Spleen: Scattered punctate calcified granuloma.

Adrenals/Urinary Tract: Normal adrenal glands. Left kidney
interpolar 5.2 cm fluid attenuating well-circumscribed structure
compatible with a cyst. No urinary stone is identified. No
hydronephrosis. No obstructive uropathy. Normal bladder.

Stomach/Bowel: Stomach is within normal limits. Appendix appears
normal. No evidence of bowel wall thickening, distention, or
inflammatory changes.

Vascular/Lymphatic: Aortic atherosclerosis. No enlarged abdominal or
pelvic lymph nodes.

Reproductive: Prostate is unremarkable. Centered in the left
inguinal fold of subcutaneous fat inferior to the pubic symphysis is
a rim enhancing fluid collection measuring 59 x 20 by 26 mm (AP x ML
x CC), series 3, image 78 and series 2, image 101. The collection is
separate from the scrotum or inguinal canal. There is surrounding
fat stranding throughout the subcutaneous fat of the left lower
anterior pelvic wall, left greater than right scrotal wall, and left
ischial anal fossa.

Other: Small paraumbilical fat containing hernia. No ascites. Small
bilateral fat containing inguinal hernias.

Musculoskeletal: Partially visualized total left hip prosthesis.
IMPRESSION: Rim enhancing collection likely representing an abscess centered in
the subcutaneous fat of the left inguinal fold just inferior to
pubic symphysis measuring up to 59 mm. Surrounding inflammatory
changes of the left-greater-than-right scrotal wall, left ischial
anal fossa, and skin of left anterior lower pelvis likely represents
cellulitis.

By: Prince Kyle China M.D.
